# Patient Record
Sex: Male | Born: 1960 | Race: Black or African American | Hispanic: No | Marital: Single | Smoking: Never smoker
Health system: Southern US, Community
[De-identification: ages and names within clinical notes are randomized; demographics above are authoritative.]

## PROBLEM LIST (undated history)

## (undated) DIAGNOSIS — Z800 Family history of malignant neoplasm of digestive organs: Secondary | ICD-10-CM

## (undated) DIAGNOSIS — G4730 Sleep apnea, unspecified: Secondary | ICD-10-CM

## (undated) DIAGNOSIS — E119 Type 2 diabetes mellitus without complications: Secondary | ICD-10-CM

## (undated) DIAGNOSIS — Z86010 Personal history of colonic polyps: Secondary | ICD-10-CM

## (undated) DIAGNOSIS — Z1211 Encounter for screening for malignant neoplasm of colon: Secondary | ICD-10-CM

## (undated) DIAGNOSIS — Z1389 Encounter for screening for other disorder: Secondary | ICD-10-CM

## (undated) HISTORY — DX: Encounter for screening for malignant neoplasm of colon: Z12.11

## (undated) HISTORY — DX: Family history of malignant neoplasm of digestive organs: Z80.0

## (undated) HISTORY — DX: Encounter for screening for other disorder: Z13.89

## (undated) HISTORY — DX: Personal history of colonic polyps: Z86.010

## (undated) HISTORY — DX: Sleep apnea, unspecified: G47.30

## (undated) HISTORY — DX: Type 2 diabetes mellitus without complications: E11.9

## (undated) SURGERY — Surgical Case
Anesthesia: *Unknown

---

## 2005-11-14 ENCOUNTER — Ambulatory Visit: Payer: Self-pay | Admitting: Family Medicine

## 2005-11-17 ENCOUNTER — Ambulatory Visit: Payer: Self-pay | Admitting: Internal Medicine

## 2006-03-16 HISTORY — PX: COLONOSCOPY W/ POLYPECTOMY: SHX1380

## 2007-01-03 ENCOUNTER — Ambulatory Visit: Payer: Self-pay | Admitting: Gastroenterology

## 2007-03-24 ENCOUNTER — Emergency Department: Payer: Self-pay | Admitting: Emergency Medicine

## 2007-03-24 ENCOUNTER — Other Ambulatory Visit: Payer: Self-pay

## 2007-05-02 ENCOUNTER — Ambulatory Visit: Payer: Self-pay | Admitting: Family Medicine

## 2011-03-17 DIAGNOSIS — Z860100 Personal history of colon polyps, unspecified: Secondary | ICD-10-CM

## 2011-03-17 DIAGNOSIS — Z1211 Encounter for screening for malignant neoplasm of colon: Secondary | ICD-10-CM

## 2011-03-17 HISTORY — DX: Encounter for screening for malignant neoplasm of colon: Z12.11

## 2011-03-17 HISTORY — DX: Personal history of colonic polyps: Z86.010

## 2011-03-17 HISTORY — DX: Personal history of colon polyps, unspecified: Z86.0100

## 2012-01-27 ENCOUNTER — Ambulatory Visit: Payer: Self-pay | Admitting: Family Medicine

## 2012-02-03 ENCOUNTER — Ambulatory Visit: Payer: Self-pay | Admitting: General Surgery

## 2012-02-03 HISTORY — PX: COLONOSCOPY: SHX174

## 2012-02-14 ENCOUNTER — Ambulatory Visit: Payer: Self-pay | Admitting: Family Medicine

## 2012-03-16 ENCOUNTER — Ambulatory Visit: Payer: Self-pay | Admitting: Family Medicine

## 2012-04-16 ENCOUNTER — Ambulatory Visit: Payer: Self-pay | Admitting: Family Medicine

## 2012-09-28 ENCOUNTER — Encounter: Payer: Self-pay | Admitting: *Deleted

## 2012-09-28 DIAGNOSIS — Z800 Family history of malignant neoplasm of digestive organs: Secondary | ICD-10-CM | POA: Insufficient documentation

## 2012-09-28 DIAGNOSIS — Z860100 Personal history of colon polyps, unspecified: Secondary | ICD-10-CM | POA: Insufficient documentation

## 2014-03-19 ENCOUNTER — Ambulatory Visit: Payer: Self-pay | Admitting: General Practice

## 2014-03-21 DIAGNOSIS — M6520 Calcific tendinitis, unspecified site: Secondary | ICD-10-CM | POA: Insufficient documentation

## 2014-03-21 DIAGNOSIS — S46919A Strain of unspecified muscle, fascia and tendon at shoulder and upper arm level, unspecified arm, initial encounter: Secondary | ICD-10-CM | POA: Insufficient documentation

## 2014-03-27 ENCOUNTER — Other Ambulatory Visit: Payer: Self-pay | Admitting: General Practice

## 2014-03-27 DIAGNOSIS — M25511 Pain in right shoulder: Secondary | ICD-10-CM

## 2014-03-30 ENCOUNTER — Ambulatory Visit
Admission: RE | Admit: 2014-03-30 | Discharge: 2014-03-30 | Disposition: A | Discharge status: Home / Self Care | Payer: BLUE CROSS/BLUE SHIELD | Source: Ambulatory Visit | Attending: General Practice | Admitting: General Practice

## 2014-03-30 DIAGNOSIS — M25511 Pain in right shoulder: Secondary | ICD-10-CM

## 2014-04-16 DIAGNOSIS — M25819 Other specified joint disorders, unspecified shoulder: Secondary | ICD-10-CM | POA: Insufficient documentation

## 2014-04-16 DIAGNOSIS — M75100 Unspecified rotator cuff tear or rupture of unspecified shoulder, not specified as traumatic: Secondary | ICD-10-CM | POA: Insufficient documentation

## 2014-04-16 DIAGNOSIS — R29898 Other symptoms and signs involving the musculoskeletal system: Secondary | ICD-10-CM | POA: Insufficient documentation

## 2014-09-25 ENCOUNTER — Encounter: Payer: Self-pay | Admitting: Family Medicine

## 2014-09-25 ENCOUNTER — Ambulatory Visit (INDEPENDENT_AMBULATORY_CARE_PROVIDER_SITE_OTHER): Payer: BLUE CROSS/BLUE SHIELD | Admitting: Family Medicine

## 2014-09-25 VITALS — BP 124/70 | HR 82 | Temp 98.6°F | Resp 18 | Ht 69.0 in | Wt 292.5 lb

## 2014-09-25 DIAGNOSIS — E669 Obesity, unspecified: Secondary | ICD-10-CM | POA: Diagnosis not present

## 2014-09-25 DIAGNOSIS — Z820 Family history of epilepsy and other diseases of the nervous system: Secondary | ICD-10-CM | POA: Diagnosis not present

## 2014-09-25 DIAGNOSIS — M25519 Pain in unspecified shoulder: Secondary | ICD-10-CM

## 2014-09-25 DIAGNOSIS — M25819 Other specified joint disorders, unspecified shoulder: Secondary | ICD-10-CM | POA: Diagnosis not present

## 2014-09-25 DIAGNOSIS — M25511 Pain in right shoulder: Secondary | ICD-10-CM | POA: Diagnosis not present

## 2014-09-25 DIAGNOSIS — M25512 Pain in left shoulder: Secondary | ICD-10-CM

## 2014-09-25 DIAGNOSIS — M6520 Calcific tendinitis, unspecified site: Secondary | ICD-10-CM

## 2014-09-25 DIAGNOSIS — Z6841 Body Mass Index (BMI) 40.0 and over, adult: Secondary | ICD-10-CM | POA: Insufficient documentation

## 2014-09-25 DIAGNOSIS — Z0000 Encounter for general adult medical examination without abnormal findings: Secondary | ICD-10-CM | POA: Diagnosis not present

## 2014-09-25 MED ORDER — SUMATRIPTAN SUCCINATE 100 MG PO TABS: 100.0000 mg | ORAL_TABLET | ORAL | Status: DC | PRN

## 2014-09-25 NOTE — Patient Instructions (Addendum)

## 2014-09-25 NOTE — Progress Notes (Signed)
Name: John York   MRN: [id number]    DOB: 15-Mar-1961   Date:09/25/2014       Progress Note  Subjective  Chief Complaint  Chief Complaint  Patient presents with  . Annual Exam    HPI  54 year old male presents for annual H&P . Since last physical he said a few new medical modalities as outlined below.  Right shoulder injury  Patient fell several months ago onto his extended arms and had a significant sprain particularly of the right shoulder. He was thought originally to have a rotator tuft cuff tear. He has done some rehabilitation and this is improving though there is still some residual pain. It is of note that he has played competitive football college level  Obesity  Patient is not doing regular aerobic exercise are the following a calorie restricted diet. He often eats on the run as he is single and works at [employer].  Migraines with aura  Patient has had 2 or 3 episodes of migraines with visual aura consisting of scotomata. The headaches are not severe. They resolved on their own. There is no associated nausea vomiting or associated focal weakness.  Past Medical History  Diagnosis Date  . Family history of malignant neoplasm of gastrointestinal tract   . Special screening for malignant neoplasms, colon 2013  . Screening for obesity   . Personal history of colonic polyps 2013    History  Substance Use Topics  . Smoking status: Never Smoker   . Smokeless tobacco: Not on file  . Alcohol Use: No     Current outpatient prescriptions:  .  Calcium Carb-Ergocalciferol 500-200 MG-UNIT TABS, Take by mouth., Disp: , Rfl:   No Known Allergies  Review of Systems  Constitutional: Negative for fever, chills and weight loss.  HENT: Negative for congestion, hearing loss, sore throat and tinnitus.   Eyes: Negative for blurred vision, double vision and redness.  Respiratory: Negative for cough, hemoptysis and shortness of breath.   Cardiovascular: Negative for  chest pain, palpitations, orthopnea, claudication and leg swelling.  Gastrointestinal: Negative for heartburn, nausea, vomiting, diarrhea, constipation and blood in stool.  Genitourinary: Negative for dysuria, urgency, frequency and hematuria.  Musculoskeletal: Negative for myalgias, back pain, joint pain, falls and neck pain.  Skin: Negative for itching.  Neurological: Positive for headaches. Negative for dizziness, tingling, tremors, focal weakness, seizures, loss of consciousness and weakness. Speech change: occasional scotomata associated with migraines.  Endo/Heme/Allergies: Does not bruise/bleed easily.  Psychiatric/Behavioral: Negative for depression and substance abuse. The patient is not nervous/anxious and does not have insomnia.      Objective  Filed Vitals:   09/25/14 1344  BP: 124/70  Pulse: 82  Temp: 98.6 F (37 C)  TempSrc: Oral  Resp: 18  Height: 5\' 9"  (1.753 m)  Weight: 292 lb 8 oz (132.677 kg)  SpO2: 97%     Physical Exam  Constitutional: He is oriented to person, place, and time and well-developed, well-nourished, and in no distress.  Obese  HENT:  Head: Normocephalic.  Eyes: EOM are normal. Pupils are equal, round, and reactive to light.  Neck: Normal range of motion. Neck supple. No thyromegaly present.  Cardiovascular: Normal rate, regular rhythm and normal heart sounds.   No murmur heard. Pulmonary/Chest: Effort normal and breath sounds normal. No respiratory distress. He has no wheezes.  Abdominal: Soft. Bowel sounds are normal.  Genitourinary: Rectum normal, prostate normal and penis normal. Guaiac negative stool. No discharge found.  Musculoskeletal: He exhibits no  edema.  Mild limitation of abduction of the right shoulder but some crepitus and discomfort with full abduction  Lymphadenopathy:    He has no cervical adenopathy.  Neurological: He is alert and oriented to person, place, and time. No cranial nerve deficit. Gait normal. Coordination  normal.  Skin: Skin is warm and dry. No rash noted.  Psychiatric: Mood, memory, affect and judgment normal.      Assessment & Plan  1. Annual physical exam Labs [facility] - Comprehensive metabolic panel - Lipid panel - TSH - CBC with Differential/Platelet - PSA  2. Obesity Patient informed, pale with thick diet  3. FHx: migraine headaches We'll give a trial of Imitrex   4. Shoulder pain, bilateral per orthopedist

## 2014-09-25 NOTE — Addendum Note (Signed)
Addended by: Ashok Norris on: 09/25/2014 03:37 PM   Modules accepted: Miquel Dunn

## 2014-10-02 ENCOUNTER — Encounter: Payer: Self-pay | Admitting: Family Medicine

## 2014-10-10 ENCOUNTER — Encounter: Payer: Self-pay | Admitting: Family Medicine

## 2015-04-01 ENCOUNTER — Encounter: Payer: Self-pay | Admitting: Family Medicine

## 2015-04-01 ENCOUNTER — Ambulatory Visit (INDEPENDENT_AMBULATORY_CARE_PROVIDER_SITE_OTHER): Payer: BLUE CROSS/BLUE SHIELD | Admitting: Family Medicine

## 2015-04-01 ENCOUNTER — Ambulatory Visit: Payer: BLUE CROSS/BLUE SHIELD | Admitting: Family Medicine

## 2015-04-01 ENCOUNTER — Ambulatory Visit
Admission: RE | Admit: 2015-04-01 | Discharge: 2015-04-01 | Disposition: A | Discharge status: Home / Self Care | Payer: BLUE CROSS/BLUE SHIELD | Source: Ambulatory Visit | Attending: Family Medicine | Admitting: Family Medicine

## 2015-04-01 VITALS — BP 124/82 | HR 87 | Temp 98.3°F | Resp 18 | Ht 69.0 in | Wt 295.9 lb

## 2015-04-01 DIAGNOSIS — M25512 Pain in left shoulder: Secondary | ICD-10-CM | POA: Insufficient documentation

## 2015-04-01 DIAGNOSIS — M542 Cervicalgia: Secondary | ICD-10-CM

## 2015-04-01 DIAGNOSIS — E669 Obesity, unspecified: Secondary | ICD-10-CM | POA: Diagnosis not present

## 2015-04-01 MED ORDER — MELOXICAM 15 MG PO TABS: 15.0000 mg | ORAL_TABLET | Freq: Every day | ORAL | Status: DC

## 2015-04-01 NOTE — Progress Notes (Signed)
Name: John York   MRN: [id number]    DOB: 1960/08/22   Date:04/01/2015       Progress Note  Subjective  Chief Complaint  Chief Complaint  Patient presents with  . Obesity    6 month recheck    HPI  Obesity many  Patient has a history of obesity for many years.  Attempts at weight loss have included diet and exercise and diet. Marland Kitchen  Results of this regimen  have been less than successful .  Patient now voices and interest in weight loss by  as minimal. He is not following the diet and exercises prescribed 6 months ago. With regular .  Past Medical History  Diagnosis Date  . Family history of malignant neoplasm of gastrointestinal tract   . Special screening for malignant neoplasms, colon 2013  . Screening for obesity   . Personal history of colonic polyps 2013    Social History  Substance Use Topics  . Smoking status: Never Smoker   . Smokeless tobacco: Not on file  . Alcohol Use: No     Current outpatient prescriptions:  .  Calcium Carb-Ergocalciferol 500-200 MG-UNIT TABS, Take by mouth., Disp: , Rfl:  .  SUMAtriptan (IMITREX) 100 MG tablet, Take 1 tablet (100 mg total) by mouth every 2 (two) hours as needed for migraine. May repeat in 2 hours if headache persists or recurs., Disp: 6 tablet, Rfl: 1  No Known Allergies  Review of Systems  Constitutional: Negative for fever, chills and weight loss.       Obesity  HENT: Negative for congestion, hearing loss, sore throat and tinnitus.   Eyes: Negative for blurred vision, double vision and redness.  Respiratory: Negative for cough, hemoptysis and shortness of breath.   Cardiovascular: Negative for chest pain, palpitations, orthopnea, claudication and leg swelling.  Gastrointestinal: Negative for heartburn, nausea, vomiting, diarrhea, constipation and blood in stool.  Genitourinary: Negative for dysuria, urgency, frequency and hematuria.  Musculoskeletal: Negative for myalgias, back pain, joint pain, falls and neck  pain.  Skin: Negative for itching.  Neurological: Negative for dizziness, tingling, tremors, focal weakness, seizures, loss of consciousness, weakness and headaches.  Endo/Heme/Allergies: Does not bruise/bleed easily.  Psychiatric/Behavioral: Negative for depression and substance abuse. The patient is not nervous/anxious and does not have insomnia.      Objective  Filed Vitals:   04/01/15 0738  BP: 124/82  Pulse: 87  Temp: 98.3 F (36.8 C)  TempSrc: Oral  Resp: 18  Height: 5\' 9"  (1.753 m)  Weight: 295 lb 14.4 oz (134.219 kg)  SpO2: 97%     Physical Exam  Constitutional: He is oriented to person, place, and time and well-developed, well-nourished, and in no distress.  Obese and in no acute distress  HENT:  Head: Normocephalic.  Eyes: EOM are normal. Pupils are equal, round, and reactive to light.  Neck: Normal range of motion. Neck supple. No thyromegaly present.  Cardiovascular: Normal rate, regular rhythm and normal heart sounds.   No murmur heard. Pulmonary/Chest: Effort normal and breath sounds normal. No respiratory distress. He has no wheezes.  Abdominal: Soft. Bowel sounds are normal.  Musculoskeletal: He exhibits no edema.  There is some left cervical discomfort and sternocleidomastoid area and mild limitation on lateral rotation. Some discomfort in the scapular area as well. Internal and external rotation at the extremes produce some discomfort as well there is tenderness along the tendons anteriorly posteriorly the shoulder.  Lymphadenopathy:    He has no cervical adenopathy.  Neurological: He is alert and oriented to person, place, and time. No cranial nerve deficit. Gait normal. Coordination normal.  Skin: Skin is warm and dry. No rash noted.  Psychiatric: Affect and judgment normal.      Assessment & Plan  1. Obesity Again complaint better with diet and exercise have emphasized today  2. Shoulder pain, acute, left Probable tendinitis and  osteoarthritis - meloxicam (MOBIC) 15 MG tablet; Take 1 tablet (15 mg total) by mouth daily.  Dispense: 30 tablet; Refill: 1 - DG Shoulder Left; Future - DG Cervical Spine Complete; Future - Ambulatory referral to Physical Therapy  3. 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  Dispense: 30 tablet; Refill: 1 - DG Shoulder Left; Future - DG Cervical Spine Complete; Future - Ambulatory referral to Physical Therapy

## 2015-05-27 ENCOUNTER — Other Ambulatory Visit: Payer: Self-pay | Admitting: Family Medicine

## 2015-10-10 ENCOUNTER — Encounter: Payer: BLUE CROSS/BLUE SHIELD | Admitting: Family Medicine

## 2016-04-16 DIAGNOSIS — M5136 Other intervertebral disc degeneration, lumbar region: Secondary | ICD-10-CM | POA: Diagnosis not present

## 2016-04-16 DIAGNOSIS — M9901 Segmental and somatic dysfunction of cervical region: Secondary | ICD-10-CM | POA: Diagnosis not present

## 2016-04-16 DIAGNOSIS — M62830 Muscle spasm of back: Secondary | ICD-10-CM | POA: Diagnosis not present

## 2016-04-16 DIAGNOSIS — M9903 Segmental and somatic dysfunction of lumbar region: Secondary | ICD-10-CM | POA: Diagnosis not present

## 2016-04-23 DIAGNOSIS — M9903 Segmental and somatic dysfunction of lumbar region: Secondary | ICD-10-CM | POA: Diagnosis not present

## 2016-04-23 DIAGNOSIS — M5136 Other intervertebral disc degeneration, lumbar region: Secondary | ICD-10-CM | POA: Diagnosis not present

## 2016-04-23 DIAGNOSIS — M62830 Muscle spasm of back: Secondary | ICD-10-CM | POA: Diagnosis not present

## 2016-04-23 DIAGNOSIS — M9901 Segmental and somatic dysfunction of cervical region: Secondary | ICD-10-CM | POA: Diagnosis not present

## 2016-04-30 DIAGNOSIS — M9903 Segmental and somatic dysfunction of lumbar region: Secondary | ICD-10-CM | POA: Diagnosis not present

## 2016-04-30 DIAGNOSIS — M9901 Segmental and somatic dysfunction of cervical region: Secondary | ICD-10-CM | POA: Diagnosis not present

## 2016-04-30 DIAGNOSIS — M62830 Muscle spasm of back: Secondary | ICD-10-CM | POA: Diagnosis not present

## 2016-04-30 DIAGNOSIS — M5136 Other intervertebral disc degeneration, lumbar region: Secondary | ICD-10-CM | POA: Diagnosis not present

## 2016-05-04 DIAGNOSIS — M5136 Other intervertebral disc degeneration, lumbar region: Secondary | ICD-10-CM | POA: Diagnosis not present

## 2016-05-04 DIAGNOSIS — M62830 Muscle spasm of back: Secondary | ICD-10-CM | POA: Diagnosis not present

## 2016-05-04 DIAGNOSIS — M9903 Segmental and somatic dysfunction of lumbar region: Secondary | ICD-10-CM | POA: Diagnosis not present

## 2016-05-04 DIAGNOSIS — M9901 Segmental and somatic dysfunction of cervical region: Secondary | ICD-10-CM | POA: Diagnosis not present

## 2016-05-13 DIAGNOSIS — M5136 Other intervertebral disc degeneration, lumbar region: Secondary | ICD-10-CM | POA: Diagnosis not present

## 2016-05-13 DIAGNOSIS — M9901 Segmental and somatic dysfunction of cervical region: Secondary | ICD-10-CM | POA: Diagnosis not present

## 2016-05-13 DIAGNOSIS — M9903 Segmental and somatic dysfunction of lumbar region: Secondary | ICD-10-CM | POA: Diagnosis not present

## 2016-05-13 DIAGNOSIS — M62830 Muscle spasm of back: Secondary | ICD-10-CM | POA: Diagnosis not present

## 2016-05-20 DIAGNOSIS — M9903 Segmental and somatic dysfunction of lumbar region: Secondary | ICD-10-CM | POA: Diagnosis not present

## 2016-05-20 DIAGNOSIS — M5136 Other intervertebral disc degeneration, lumbar region: Secondary | ICD-10-CM | POA: Diagnosis not present

## 2016-05-20 DIAGNOSIS — M9901 Segmental and somatic dysfunction of cervical region: Secondary | ICD-10-CM | POA: Diagnosis not present

## 2016-05-20 DIAGNOSIS — M62830 Muscle spasm of back: Secondary | ICD-10-CM | POA: Diagnosis not present

## 2016-05-28 DIAGNOSIS — M9903 Segmental and somatic dysfunction of lumbar region: Secondary | ICD-10-CM | POA: Diagnosis not present

## 2016-05-28 DIAGNOSIS — M62830 Muscle spasm of back: Secondary | ICD-10-CM | POA: Diagnosis not present

## 2016-05-28 DIAGNOSIS — M5136 Other intervertebral disc degeneration, lumbar region: Secondary | ICD-10-CM | POA: Diagnosis not present

## 2016-05-28 DIAGNOSIS — M9901 Segmental and somatic dysfunction of cervical region: Secondary | ICD-10-CM | POA: Diagnosis not present

## 2016-06-04 DIAGNOSIS — M5136 Other intervertebral disc degeneration, lumbar region: Secondary | ICD-10-CM | POA: Diagnosis not present

## 2016-06-04 DIAGNOSIS — M9901 Segmental and somatic dysfunction of cervical region: Secondary | ICD-10-CM | POA: Diagnosis not present

## 2016-06-04 DIAGNOSIS — M62830 Muscle spasm of back: Secondary | ICD-10-CM | POA: Diagnosis not present

## 2016-06-04 DIAGNOSIS — M9903 Segmental and somatic dysfunction of lumbar region: Secondary | ICD-10-CM | POA: Diagnosis not present

## 2016-06-10 DIAGNOSIS — M5136 Other intervertebral disc degeneration, lumbar region: Secondary | ICD-10-CM | POA: Diagnosis not present

## 2016-06-10 DIAGNOSIS — M62830 Muscle spasm of back: Secondary | ICD-10-CM | POA: Diagnosis not present

## 2016-06-10 DIAGNOSIS — M9903 Segmental and somatic dysfunction of lumbar region: Secondary | ICD-10-CM | POA: Diagnosis not present

## 2016-06-10 DIAGNOSIS — M9901 Segmental and somatic dysfunction of cervical region: Secondary | ICD-10-CM | POA: Diagnosis not present

## 2016-06-24 DIAGNOSIS — M9901 Segmental and somatic dysfunction of cervical region: Secondary | ICD-10-CM | POA: Diagnosis not present

## 2016-06-24 DIAGNOSIS — M62830 Muscle spasm of back: Secondary | ICD-10-CM | POA: Diagnosis not present

## 2016-06-24 DIAGNOSIS — M9903 Segmental and somatic dysfunction of lumbar region: Secondary | ICD-10-CM | POA: Diagnosis not present

## 2016-06-24 DIAGNOSIS — M5136 Other intervertebral disc degeneration, lumbar region: Secondary | ICD-10-CM | POA: Diagnosis not present

## 2016-07-09 DIAGNOSIS — M62830 Muscle spasm of back: Secondary | ICD-10-CM | POA: Diagnosis not present

## 2016-07-09 DIAGNOSIS — M9901 Segmental and somatic dysfunction of cervical region: Secondary | ICD-10-CM | POA: Diagnosis not present

## 2016-07-09 DIAGNOSIS — M9903 Segmental and somatic dysfunction of lumbar region: Secondary | ICD-10-CM | POA: Diagnosis not present

## 2016-07-09 DIAGNOSIS — M5136 Other intervertebral disc degeneration, lumbar region: Secondary | ICD-10-CM | POA: Diagnosis not present

## 2016-07-23 DIAGNOSIS — M9901 Segmental and somatic dysfunction of cervical region: Secondary | ICD-10-CM | POA: Diagnosis not present

## 2016-07-23 DIAGNOSIS — M5136 Other intervertebral disc degeneration, lumbar region: Secondary | ICD-10-CM | POA: Diagnosis not present

## 2016-07-23 DIAGNOSIS — M62830 Muscle spasm of back: Secondary | ICD-10-CM | POA: Diagnosis not present

## 2016-07-23 DIAGNOSIS — M9903 Segmental and somatic dysfunction of lumbar region: Secondary | ICD-10-CM | POA: Diagnosis not present

## 2016-08-05 ENCOUNTER — Ambulatory Visit: Payer: Self-pay | Admitting: Medical

## 2016-08-05 VITALS — BP 120/75 | HR 74 | Temp 97.5°F | Resp 20 | Ht 69.0 in | Wt 301.0 lb

## 2016-08-05 DIAGNOSIS — R1084 Generalized abdominal pain: Secondary | ICD-10-CM

## 2016-08-05 DIAGNOSIS — R1013 Epigastric pain: Secondary | ICD-10-CM

## 2016-08-05 MED ORDER — OMEPRAZOLE 20 MG PO CPDR: 20.0000 mg | DELAYED_RELEASE_CAPSULE | Freq: Every day | ORAL | 1 refills | Status: DC

## 2016-08-05 NOTE — Patient Instructions (Signed)
Epigastric pain e-prescribed  Omeprazole 20mg  daily #30 one refill Diarrhea try OTC Pepto Bismol take as directed. Bland diet Brat diet  Bananas , rice , applesauce and toast. Rest and increase fluids.  May take OTC tylenol as needed for pain, take as directed. Return to the clinic if symptoms worsen , vomiting or if not improving in  2-3 days.

## 2016-08-05 NOTE — Progress Notes (Signed)
   Subjective:    Patient ID: John York, male    DOB: [birth year], 56 y.o.   MRN: [id number]  HPI 56 yo  Male with headache yesterday, ate [place] for lunch. Felt tired and weak. Diarrhea   4 times yesterday. No blood in stool.  Felt hot yesterday and body aches.  This morning  3 times diarrhea today but not much came out. Mild nausea no vomiting , Pain is dull , intermittent and last a few minutes or so. Headache today as well. Took two tylenol   last night does not know milligrams.   Taking vitamin D3 4000 IU/day. Pain located epigastric area.  Review of Systems  Constitutional: Positive for fever.  HENT: Negative for congestion, ear discharge and sore throat.   Eyes: Negative for discharge.  Respiratory: Negative for shortness of breath.   Cardiovascular: Negative for chest pain.  Gastrointestinal: Positive for abdominal pain, diarrhea and nausea. Negative for vomiting.  Endocrine: Negative for polyuria.  Genitourinary: Negative for hematuria.  Musculoskeletal: Negative for myalgias.  Skin: Negative for rash.  Allergic/Immunologic: Negative for environmental allergies and food allergies.  Neurological: Positive for light-headedness. Negative for syncope.  Psychiatric/Behavioral: Negative for confusion and hallucinations.   Lightheadedness feels like vertigo he has had in the past.    Objective:   Physical Exam  Constitutional: He is oriented to person, place, and time. He appears well-nourished.  HENT:  Head: Normocephalic and atraumatic.  Right Ear: External ear normal.  Left Ear: External ear normal.  Nose: Nose normal.  Mouth/Throat: Oropharynx is clear and moist.  Eyes: EOM are normal. Pupils are equal, round, and reactive to light.  Neck: Normal range of motion. Neck supple.  Cardiovascular: Normal rate, regular rhythm and normal heart sounds.  Exam reveals no gallop and no friction rub.   No murmur heard. Pulmonary/Chest: Effort normal and breath sounds normal.   Abdominal: Soft. There is tenderness in the epigastric area. There is no rigidity, no rebound, no guarding, no tenderness at McBurney's point and negative Murphy's sign.  Musculoskeletal: Normal range of motion.  Neurological: He is alert and oriented to person, place, and time.  Skin: Skin is warm and dry.  Psychiatric: He has a normal mood and affect. His behavior is normal.  Nursing note and vitals reviewed.    Morbid obese abdomen Tender epigastric + bs increased lower abdomen., non-tender.    Assessment & Plan:  Epigastric pain e-prescribed  Omeprazole 20mg  daily #30 one refill Diarrhea try OTC Pepto Bismol take as directed. Bland diet  Rest and increase fluids.  May take OTC tylenol as needed for pain, take as directed. Work note given to patient. Return to the clinic if symptoms worsen , vomiting or if not improving in  2-3 days.

## 2016-08-06 ENCOUNTER — Telehealth: Payer: Self-pay | Admitting: Medical

## 2016-08-06 LAB — CBC WITH DIFFERENTIAL/PLATELET
BASOS ABS: 0 10*3/uL (ref 0.0–0.2)
BASOS: 0 %
EOS (ABSOLUTE): 0 10*3/uL (ref 0.0–0.4)
Eos: 1 %
Hematocrit: 44.7 % (ref 37.5–51.0)
Hemoglobin: 15.7 g/dL (ref 13.0–17.7)
IMMATURE GRANULOCYTES: 1 %
Immature Grans (Abs): 0 10*3/uL (ref 0.0–0.1)
Lymphocytes Absolute: 0.8 10*3/uL (ref 0.7–3.1)
Lymphs: 20 %
MCH: 30.2 pg (ref 26.6–33.0)
MCHC: 35.1 g/dL (ref 31.5–35.7)
MCV: 86 fL (ref 79–97)
MONOS ABS: 0.4 10*3/uL (ref 0.1–0.9)
Monocytes: 11 %
Neutrophils Absolute: 2.9 10*3/uL (ref 1.4–7.0)
Neutrophils: 67 %
PLATELETS: 212 10*3/uL (ref 150–379)
RBC: 5.2 x10E6/uL (ref 4.14–5.80)
RDW: 14.3 % (ref 12.3–15.4)
WBC: 4.2 10*3/uL (ref 3.4–10.8)

## 2016-08-06 LAB — COMPREHENSIVE METABOLIC PANEL
ALK PHOS: 97 IU/L (ref 39–117)
ALT: 24 IU/L (ref 0–44)
AST: 25 IU/L (ref 0–40)
Albumin/Globulin Ratio: 1.5 (ref 1.2–2.2)
Albumin: 4.1 g/dL (ref 3.5–5.5)
BUN/Creatinine Ratio: 12 (ref 9–20)
BUN: 13 mg/dL (ref 6–24)
Bilirubin Total: 0.5 mg/dL (ref 0.0–1.2)
CHLORIDE: 99 mmol/L (ref 96–106)
CO2: 26 mmol/L (ref 18–29)
Calcium: 9 mg/dL (ref 8.7–10.2)
Creatinine, Ser: 1.13 mg/dL (ref 0.76–1.27)
GFR calc Af Amer: 84 mL/min/{1.73_m2} (ref >59)
GFR, EST NON AFRICAN AMERICAN: 72 mL/min/{1.73_m2} (ref >59)
GLUCOSE: 94 mg/dL (ref 65–99)
Globulin, Total: 2.8 g/dL (ref 1.5–4.5)
POTASSIUM: 4.1 mmol/L (ref 3.5–5.2)
SODIUM: 136 mmol/L (ref 134–144)
Total Protein: 6.9 g/dL (ref 6.0–8.5)

## 2016-08-06 LAB — LIPASE: Lipase: 27 U/L (ref 13–78)

## 2016-08-06 NOTE — Telephone Encounter (Signed)
Called twice no answer. Left message to please return my call.

## 2016-08-06 NOTE — Telephone Encounter (Signed)
Patient called back , feeling better today. Reviewed labs with patient . All within normal limits. Return to the clinic as needed.

## 2016-08-17 DIAGNOSIS — M9903 Segmental and somatic dysfunction of lumbar region: Secondary | ICD-10-CM | POA: Diagnosis not present

## 2016-08-17 DIAGNOSIS — M62830 Muscle spasm of back: Secondary | ICD-10-CM | POA: Diagnosis not present

## 2016-08-17 DIAGNOSIS — M5136 Other intervertebral disc degeneration, lumbar region: Secondary | ICD-10-CM | POA: Diagnosis not present

## 2016-08-17 DIAGNOSIS — M9901 Segmental and somatic dysfunction of cervical region: Secondary | ICD-10-CM | POA: Diagnosis not present

## 2016-08-27 DIAGNOSIS — M9901 Segmental and somatic dysfunction of cervical region: Secondary | ICD-10-CM | POA: Diagnosis not present

## 2016-08-27 DIAGNOSIS — M62830 Muscle spasm of back: Secondary | ICD-10-CM | POA: Diagnosis not present

## 2016-08-27 DIAGNOSIS — M5136 Other intervertebral disc degeneration, lumbar region: Secondary | ICD-10-CM | POA: Diagnosis not present

## 2016-08-27 DIAGNOSIS — M9903 Segmental and somatic dysfunction of lumbar region: Secondary | ICD-10-CM | POA: Diagnosis not present

## 2016-08-31 DIAGNOSIS — M9901 Segmental and somatic dysfunction of cervical region: Secondary | ICD-10-CM | POA: Diagnosis not present

## 2016-08-31 DIAGNOSIS — M5136 Other intervertebral disc degeneration, lumbar region: Secondary | ICD-10-CM | POA: Diagnosis not present

## 2016-08-31 DIAGNOSIS — M9903 Segmental and somatic dysfunction of lumbar region: Secondary | ICD-10-CM | POA: Diagnosis not present

## 2016-08-31 DIAGNOSIS — M62830 Muscle spasm of back: Secondary | ICD-10-CM | POA: Diagnosis not present

## 2016-09-17 DIAGNOSIS — M9901 Segmental and somatic dysfunction of cervical region: Secondary | ICD-10-CM | POA: Diagnosis not present

## 2016-09-17 DIAGNOSIS — M62830 Muscle spasm of back: Secondary | ICD-10-CM | POA: Diagnosis not present

## 2016-09-17 DIAGNOSIS — M9903 Segmental and somatic dysfunction of lumbar region: Secondary | ICD-10-CM | POA: Diagnosis not present

## 2016-09-17 DIAGNOSIS — M5136 Other intervertebral disc degeneration, lumbar region: Secondary | ICD-10-CM | POA: Diagnosis not present

## 2016-10-07 DIAGNOSIS — M9901 Segmental and somatic dysfunction of cervical region: Secondary | ICD-10-CM | POA: Diagnosis not present

## 2016-10-07 DIAGNOSIS — M5136 Other intervertebral disc degeneration, lumbar region: Secondary | ICD-10-CM | POA: Diagnosis not present

## 2016-10-07 DIAGNOSIS — M9903 Segmental and somatic dysfunction of lumbar region: Secondary | ICD-10-CM | POA: Diagnosis not present

## 2016-10-07 DIAGNOSIS — M62830 Muscle spasm of back: Secondary | ICD-10-CM | POA: Diagnosis not present

## 2016-10-21 DIAGNOSIS — M62830 Muscle spasm of back: Secondary | ICD-10-CM | POA: Diagnosis not present

## 2016-10-21 DIAGNOSIS — M5136 Other intervertebral disc degeneration, lumbar region: Secondary | ICD-10-CM | POA: Diagnosis not present

## 2016-10-21 DIAGNOSIS — M9903 Segmental and somatic dysfunction of lumbar region: Secondary | ICD-10-CM | POA: Diagnosis not present

## 2016-10-21 DIAGNOSIS — M9901 Segmental and somatic dysfunction of cervical region: Secondary | ICD-10-CM | POA: Diagnosis not present

## 2016-11-12 DIAGNOSIS — M62830 Muscle spasm of back: Secondary | ICD-10-CM | POA: Diagnosis not present

## 2016-11-12 DIAGNOSIS — M9903 Segmental and somatic dysfunction of lumbar region: Secondary | ICD-10-CM | POA: Diagnosis not present

## 2016-11-12 DIAGNOSIS — M9901 Segmental and somatic dysfunction of cervical region: Secondary | ICD-10-CM | POA: Diagnosis not present

## 2016-11-12 DIAGNOSIS — M5136 Other intervertebral disc degeneration, lumbar region: Secondary | ICD-10-CM | POA: Diagnosis not present

## 2016-12-07 DIAGNOSIS — M62830 Muscle spasm of back: Secondary | ICD-10-CM | POA: Diagnosis not present

## 2016-12-07 DIAGNOSIS — M9903 Segmental and somatic dysfunction of lumbar region: Secondary | ICD-10-CM | POA: Diagnosis not present

## 2016-12-07 DIAGNOSIS — M9901 Segmental and somatic dysfunction of cervical region: Secondary | ICD-10-CM | POA: Diagnosis not present

## 2016-12-07 DIAGNOSIS — M5136 Other intervertebral disc degeneration, lumbar region: Secondary | ICD-10-CM | POA: Diagnosis not present

## 2016-12-21 DIAGNOSIS — M9903 Segmental and somatic dysfunction of lumbar region: Secondary | ICD-10-CM | POA: Diagnosis not present

## 2016-12-21 DIAGNOSIS — M5136 Other intervertebral disc degeneration, lumbar region: Secondary | ICD-10-CM | POA: Diagnosis not present

## 2016-12-21 DIAGNOSIS — M62830 Muscle spasm of back: Secondary | ICD-10-CM | POA: Diagnosis not present

## 2016-12-21 DIAGNOSIS — M9901 Segmental and somatic dysfunction of cervical region: Secondary | ICD-10-CM | POA: Diagnosis not present

## 2017-01-06 DIAGNOSIS — M5136 Other intervertebral disc degeneration, lumbar region: Secondary | ICD-10-CM | POA: Diagnosis not present

## 2017-01-06 DIAGNOSIS — M62830 Muscle spasm of back: Secondary | ICD-10-CM | POA: Diagnosis not present

## 2017-01-06 DIAGNOSIS — M9901 Segmental and somatic dysfunction of cervical region: Secondary | ICD-10-CM | POA: Diagnosis not present

## 2017-01-06 DIAGNOSIS — M9903 Segmental and somatic dysfunction of lumbar region: Secondary | ICD-10-CM | POA: Diagnosis not present

## 2017-01-14 ENCOUNTER — Ambulatory Visit: Payer: Self-pay | Admitting: General Surgery

## 2017-01-18 ENCOUNTER — Ambulatory Visit: Payer: BLUE CROSS/BLUE SHIELD | Admitting: General Surgery

## 2017-01-18 ENCOUNTER — Encounter: Payer: Self-pay | Admitting: General Surgery

## 2017-01-18 VITALS — BP 130/78 | HR 78 | Resp 14 | Ht 69.0 in | Wt 310.0 lb

## 2017-01-18 DIAGNOSIS — Z800 Family history of malignant neoplasm of digestive organs: Secondary | ICD-10-CM | POA: Diagnosis not present

## 2017-01-18 DIAGNOSIS — Z86010 Personal history of colonic polyps: Secondary | ICD-10-CM | POA: Diagnosis not present

## 2017-01-18 MED ORDER — POLYETHYLENE GLYCOL 3350 17 GM/SCOOP PO POWD: 1.0000 | Freq: Once | ORAL | 0 refills | Status: AC

## 2017-01-18 NOTE — Progress Notes (Signed)
Patient ID: John York, male   DOB: 10/24/1960, 56 y.o.   MRN: [id number]  Chief Complaint  Patient presents with  . Colonoscopy    HPI John York is a 56 y.o. male here today for a evaluation of a colonoscopy . Last colonoscopy was done on 02/03/2012 and revealed one polyp. Patient denies GI problems at this time. Moves his bowels daily. Positive family history of colon cancer- brother.   HPI  Past Medical History:  Diagnosis Date  . Family history of malignant neoplasm of gastrointestinal tract   . Personal history of colonic polyps 2013  . Screening for obesity   . Special screening for malignant neoplasms, colon 2013    Past Surgical History:  Procedure Laterality Date  . COLONOSCOPY  02/03/2012   Dr. Jamal Collin  . COLONOSCOPY W/ POLYPECTOMY  2008   56mm size sessile non bleeding polyp was found in the sigmoid colon    Family History  Problem Relation Age of Onset  . Cancer Sister        breast  . Colon cancer Brother     Social History Social History   Tobacco Use  . Smoking status: Never Smoker  . Smokeless tobacco: Never Used  Substance Use Topics  . Alcohol use: No    Alcohol/week: 0.0 oz  . Drug use: No    No Known Allergies  Current Outpatient Medications  Medication Sig Dispense Refill  . cholecalciferol (VITAMIN D) 1000 units tablet Take 1,000 Units daily by mouth.    . polyethylene glycol powder (GLYCOLAX/MIRALAX) powder Take 255 g once for 1 dose by mouth. 255 grams one bottle for colonoscopy prep 255 g 0   No current facility-administered medications for this visit.     Review of Systems Review of Systems  Constitutional: Negative.   Respiratory: Negative.   Cardiovascular: Negative.   Gastrointestinal: Negative.     Blood pressure 130/78, pulse 78, resp. rate 14, height 5\' 9"  (1.753 m), weight (!) 310 lb (140.6 kg).  Physical Exam Physical Exam  Constitutional: He is oriented to person, place, and time. He appears  well-developed and well-nourished.  Eyes: Conjunctivae are normal. No scleral icterus.  Neck: Neck supple.  Cardiovascular: Normal rate, regular rhythm and normal heart sounds.  Pulmonary/Chest: Effort normal and breath sounds normal.  Abdominal: Soft. Bowel sounds are normal. There is no tenderness. No hernia. Hernia confirmed negative in the ventral area, confirmed negative in the right inguinal area and confirmed negative in the left inguinal area.  Lymphadenopathy:    He has no cervical adenopathy.  Neurological: He is alert and oriented to person, place, and time.  Skin: Skin is warm and dry.    Data Reviewed Prior notes and colonoscopy reviewed   Assessment    Pt with positive family hx of colon cancer and personal history of polyps is due for colonoscopy. Last colonoscopy and single polypectomy was 02/03/12. Stable exam today.    Plan    Colonoscopy with possible biopsy/polypectomy prn: Information regarding the procedure, including its potential risks and complications (including but not limited to perforation of the bowel, which may require emergency surgery to repair, and bleeding) was verbally given to the patient. Educational information regarding lower intestinal endoscopy was given to the patient. Written instructions for how to complete the bowel prep using Miralax were provided. The importance of drinking ample fluids to avoid dehydration as a result of the prep emphasized.  HPI, Physical Exam, Assessment and Plan have been scribed under  the direction and in the presence of Mckinley Jewel, MD  Gaspar Cola, CMA      I have completed the exam and reviewed the above documentation for accuracy and completeness.  I agree with the above.  Haematologist has been used and any errors in dictation or transcription are unintentional.  Seeplaputhur G. Jamal Collin, M.D., F.A.C.S.  Junie Panning G 01/18/2017, 11:43 AM  Patient has been scheduled for a colonoscopy on  02-24-17 at [facility]. Miralax prescription has been sent in to the patient's pharmacy today. Colonoscopy instructions have been reviewed with the patient. This patient is aware to call the office if they have further questions.   Dominga Ferry, CMA

## 2017-01-18 NOTE — Patient Instructions (Signed)
Colonoscopy, Adult A colonoscopy is an exam to look at the entire large intestine. During the exam, a lubricated, bendable tube is inserted into the anus and then passed into the rectum, colon, and other parts of the large intestine. A colonoscopy is often done as a part of normal colorectal screening or in response to certain symptoms, such as anemia, persistent diarrhea, abdominal pain, and blood in the stool. The exam can help screen for and diagnose medical problems, including:  Tumors.  Polyps.  Inflammation.  Areas of bleeding.  Tell a health care provider about:  Any allergies you have.  All medicines you are taking, including vitamins, herbs, eye drops, creams, and over-the-counter medicines.  Any problems you or family members have had with anesthetic medicines.  Any blood disorders you have.  Any surgeries you have had.  Any medical conditions you have.  Any problems you have had passing stool. What are the risks? Generally, this is a safe procedure. However, problems may occur, including:  Bleeding.  A tear in the intestine.  A reaction to medicines given during the exam.  Infection (rare).  What happens before the procedure? Eating and drinking restrictions Follow instructions from your health care provider about eating and drinking, which may include:  A few days before the procedure - follow a low-fiber diet. Avoid nuts, seeds, dried fruit, raw fruits, and vegetables.  1-3 days before the procedure - follow a clear liquid diet. Drink only clear liquids, such as clear broth or bouillon, black coffee or tea, clear juice, clear soft drinks or sports drinks, gelatin dessert, and popsicles. Avoid any liquids that contain red or purple dye.  On the day of the procedure - do not eat or drink anything during the 2 hours before the procedure, or within the time period that your health care provider recommends.  Bowel prep If you were prescribed an oral bowel prep  to clean out your colon:  Take it as told by your health care provider. Starting the day before your procedure, you will need to drink a large amount of medicated liquid. The liquid will cause you to have multiple loose stools until your stool is almost clear or light green.  If your skin or anus gets irritated from diarrhea, you may use these to relieve the irritation: ? Medicated wipes, such as adult wet wipes with aloe and vitamin E. ? A skin soothing-product like petroleum jelly.  If you vomit while drinking the bowel prep, take a break for up to 60 minutes and then begin the bowel prep again. If vomiting continues and you cannot take the bowel prep without vomiting, call your health care provider.  General instructions  Ask your health care provider about changing or stopping your regular medicines. This is especially important if you are taking diabetes medicines or blood thinners.  Plan to have someone take you home from the hospital or clinic. What happens during the procedure?  An IV tube may be inserted into one of your veins.  You will be given medicine to help you relax (sedative).  To reduce your risk of infection: ? Your health care team will wash or sanitize their hands. ? Your anal area will be washed with soap.  You will be asked to lie on your side with your knees bent.  Your health care provider will lubricate a long, thin, flexible tube. The tube will have a camera and a light on the end.  The tube will be inserted into your   anus.  The tube will be gently eased through your rectum and colon.  Air will be delivered into your colon to keep it open. You may feel some pressure or cramping.  The camera will be used to take images during the procedure.  A small tissue sample may be removed from your body to be examined under a microscope (biopsy). If any potential problems are found, the tissue will be sent to a lab for testing.  If small polyps are found, your  health care provider may remove them and have them checked for cancer cells.  The tube that was inserted into your anus will be slowly removed. The procedure may vary among health care providers and hospitals. What happens after the procedure?  Your blood pressure, heart rate, breathing rate, and blood oxygen level will be monitored until the medicines you were given have worn off.  Do not drive for 24 hours after the exam.  You may have a small amount of blood in your stool.  You may pass gas and have mild abdominal cramping or bloating due to the air that was used to inflate your colon during the exam.  It is up to you to get the results of your procedure. Ask your health care provider, or the department performing the procedure, when your results will be ready. This information is not intended to replace advice given to you by your health care provider. Make sure you discuss any questions you have with your health care provider. Document Released: 02/28/2000 Document Revised: 01/01/2016 Document Reviewed: 05/14/2015 Elsevier Interactive Patient Education  2018 [employer]  

## 2017-01-19 ENCOUNTER — Encounter: Payer: Self-pay | Admitting: General Surgery

## 2017-01-21 DIAGNOSIS — M9903 Segmental and somatic dysfunction of lumbar region: Secondary | ICD-10-CM | POA: Diagnosis not present

## 2017-01-21 DIAGNOSIS — M5136 Other intervertebral disc degeneration, lumbar region: Secondary | ICD-10-CM | POA: Diagnosis not present

## 2017-01-21 DIAGNOSIS — M62830 Muscle spasm of back: Secondary | ICD-10-CM | POA: Diagnosis not present

## 2017-01-21 DIAGNOSIS — M9901 Segmental and somatic dysfunction of cervical region: Secondary | ICD-10-CM | POA: Diagnosis not present

## 2017-02-17 DIAGNOSIS — M9901 Segmental and somatic dysfunction of cervical region: Secondary | ICD-10-CM | POA: Diagnosis not present

## 2017-02-17 DIAGNOSIS — M9903 Segmental and somatic dysfunction of lumbar region: Secondary | ICD-10-CM | POA: Diagnosis not present

## 2017-02-17 DIAGNOSIS — M62830 Muscle spasm of back: Secondary | ICD-10-CM | POA: Diagnosis not present

## 2017-02-17 DIAGNOSIS — M5136 Other intervertebral disc degeneration, lumbar region: Secondary | ICD-10-CM | POA: Diagnosis not present

## 2017-02-23 ENCOUNTER — Other Ambulatory Visit: Payer: Self-pay | Admitting: General Surgery

## 2017-02-23 DIAGNOSIS — Z86010 Personal history of colonic polyps: Secondary | ICD-10-CM

## 2017-02-24 ENCOUNTER — Encounter
Admission: RE | Disposition: A | Discharge status: Home / Self Care | Payer: Self-pay | Source: Ambulatory Visit | Attending: General Surgery

## 2017-02-24 ENCOUNTER — Ambulatory Visit: Payer: BLUE CROSS/BLUE SHIELD | Admitting: Anesthesiology

## 2017-02-24 ENCOUNTER — Encounter: Payer: Self-pay | Admitting: *Deleted

## 2017-02-24 ENCOUNTER — Ambulatory Visit
Admission: RE | Admit: 2017-02-24 | Discharge: 2017-02-24 | Disposition: A | Discharge status: Home / Self Care | Payer: BLUE CROSS/BLUE SHIELD | Source: Ambulatory Visit | Attending: General Surgery | Admitting: General Surgery

## 2017-02-24 DIAGNOSIS — D124 Benign neoplasm of descending colon: Secondary | ICD-10-CM | POA: Insufficient documentation

## 2017-02-24 DIAGNOSIS — Z8371 Family history of colonic polyps: Secondary | ICD-10-CM | POA: Diagnosis not present

## 2017-02-24 DIAGNOSIS — Z79899 Other long term (current) drug therapy: Secondary | ICD-10-CM | POA: Insufficient documentation

## 2017-02-24 DIAGNOSIS — Z86010 Personal history of colonic polyps: Secondary | ICD-10-CM | POA: Diagnosis not present

## 2017-02-24 DIAGNOSIS — E669 Obesity, unspecified: Secondary | ICD-10-CM | POA: Insufficient documentation

## 2017-02-24 DIAGNOSIS — Z1211 Encounter for screening for malignant neoplasm of colon: Secondary | ICD-10-CM | POA: Insufficient documentation

## 2017-02-24 DIAGNOSIS — Z800 Family history of malignant neoplasm of digestive organs: Secondary | ICD-10-CM | POA: Insufficient documentation

## 2017-02-24 DIAGNOSIS — Z6841 Body Mass Index (BMI) 40.0 and over, adult: Secondary | ICD-10-CM | POA: Insufficient documentation

## 2017-02-24 DIAGNOSIS — K635 Polyp of colon: Secondary | ICD-10-CM | POA: Diagnosis not present

## 2017-02-24 HISTORY — PX: COLONOSCOPY WITH PROPOFOL: SHX5780

## 2017-02-24 SURGERY — COLONOSCOPY WITH PROPOFOL
Anesthesia: General

## 2017-02-24 MED ORDER — PROPOFOL 500 MG/50ML IV EMUL
INTRAVENOUS | Status: AC
  Filled 2017-02-24: qty 50

## 2017-02-24 MED ORDER — PROPOFOL 500 MG/50ML IV EMUL
INTRAVENOUS | Status: DC | PRN
  Administered 2017-02-24: 125 ug/kg/min via INTRAVENOUS

## 2017-02-24 MED ORDER — SODIUM CHLORIDE 0.9 % IV SOLN
INTRAVENOUS | Status: DC | PRN
  Administered 2017-02-24: 11:00:00 via INTRAVENOUS

## 2017-02-24 MED ORDER — LIDOCAINE HCL (CARDIAC) 20 MG/ML IV SOLN
INTRAVENOUS | Status: DC | PRN
  Administered 2017-02-24: 50 mg via INTRAVENOUS

## 2017-02-24 MED ORDER — PROPOFOL 10 MG/ML IV BOLUS
INTRAVENOUS | Status: DC | PRN
  Administered 2017-02-24: 80 mg via INTRAVENOUS

## 2017-02-24 MED ORDER — SODIUM CHLORIDE 0.9 % IV SOLN
INTRAVENOUS | Status: DC
  Administered 2017-02-24: 10:00:00 via INTRAVENOUS

## 2017-02-24 NOTE — H&P (Signed)
John York is an 57 y.o. male.   Chief Complaint: Here for colonoscopy HPI: 56 yr old male with FH of colon CA and personal history of colon polyp-due for surveillance colonoscopy. No GI complaints. No interval changes since OV of 01/18/17  Past Medical History:  Diagnosis Date  . Family history of malignant neoplasm of gastrointestinal tract   . Personal history of colonic polyps 2013  . Screening for obesity   . Special screening for malignant neoplasms, colon 2013    Past Surgical History:  Procedure Laterality Date  . COLONOSCOPY  02/03/2012   Dr. Jamal Collin  . COLONOSCOPY W/ POLYPECTOMY  2008   85mm size sessile non bleeding polyp was found in the sigmoid colon    Family History  Problem Relation Age of Onset  . Cancer Sister        breast  . Colon cancer Brother    Social History:  reports that  has never smoked. he has never used smokeless tobacco. He reports that he does not drink alcohol or use drugs.  Allergies: No Known Allergies  Medications Prior to Admission  Medication Sig Dispense Refill  . cholecalciferol (VITAMIN D) 1000 units tablet Take 1,000 Units daily by mouth.      No results found for this or any previous visit (from the past 48 hour(s)). No results found.  Review of Systems  Constitutional: Negative.   Respiratory: Negative.   Cardiovascular: Negative.   Gastrointestinal: Negative.   Genitourinary: Negative.     Blood pressure (!) 159/94, pulse 65, temperature (!) 96.1 F (35.6 C), temperature source Tympanic, resp. rate 20, height 5\' 9"  (1.753 m), weight 300 lb (136.1 kg), SpO2 100 %. Physical Exam  Constitutional: He is oriented to person, place, and time. He appears well-nourished.  Eyes: Conjunctivae are normal. No scleral icterus.  Neck: Neck supple.  Cardiovascular: Normal rate, regular rhythm and normal heart sounds.  Respiratory: Effort normal and breath sounds normal.  GI: Soft. Bowel sounds are normal. He exhibits no  distension and no mass.  Lymphadenopathy:    He has no cervical adenopathy.  Neurological: He is alert and oriented to person, place, and time.  Skin: Skin is warm and dry.     Assessment/Plan Proceed with colonoscopy as planned  Christene Lye, MD 02/24/2017, 11:03 AM

## 2017-02-24 NOTE — Transfer of Care (Signed)
Immediate Anesthesia Transfer of Care Note  Patient: TREVELL PARISEAU  Procedure(s) Performed: COLONOSCOPY WITH PROPOFOL (N/A )  Patient Location: Endoscopy Unit  Anesthesia Type:General  Level of Consciousness: sedated  Airway & Oxygen Therapy: Patient Spontanous Breathing and Patient connected to nasal cannula oxygen  Post-op Assessment: Report given to RN and Post -op Vital signs reviewed and stable  Post vital signs: Reviewed and stable  Last Vitals:  Vitals:   02/24/17 1000 02/24/17 1140  BP: (!) 159/94 118/74  Pulse: 65 76  Resp: 20 20  Temp: (!) 35.6 C (!) 36.1 C  SpO2: 100% 100%    Last Pain:  Vitals:   02/24/17 1140  TempSrc: Tympanic         Complications: No apparent anesthesia complications

## 2017-02-24 NOTE — Interval H&P Note (Signed)
History and Physical Interval Note:  02/24/2017 11:06 AM  John York  has presented today for surgery, with the diagnosis of FH COLON POLYPS PH COLON POLYPS  The various methods of treatment have been discussed with the patient and family. After consideration of risks, benefits and other options for treatment, the patient has consented to  Procedure(s): COLONOSCOPY WITH PROPOFOL (N/A) as a surgical intervention .  The patient's history has been reviewed, patient examined, no change in status, stable for surgery.  I have reviewed the patient's chart and labs.  Questions were answered to the patient's satisfaction.     SANKAR,SEEPLAPUTHUR G

## 2017-02-24 NOTE — Op Note (Signed)
[facility] Gastroenterology Patient Name: John York Procedure Date: 02/24/2017 11:09 AM MRN: [id number] Account #: [id number] Date of Birth: May 30, 1960 Admit Type: Outpatient Age: 56 Room: [facility] ENDO ROOM 1 Gender: Male Note Status: Finalized Procedure:            Colonoscopy Indications:          High risk colon cancer surveillance: Personal history                        of non-advanced adenoma Providers:            Seeplaputhur G. Jamal Collin, MD Medicines:            General Anesthesia Complications:        No immediate complications. Procedure:            Pre-Anesthesia Assessment:                       - General anesthesia under the supervision of an                        anesthesiologist was determined to be medically                        necessary for this procedure based on review of the                        patient's medical history, medications, and prior                        anesthesia history.                       After obtaining informed consent, the colonoscope was                        passed under direct vision. Throughout the procedure,                        the patient's blood pressure, pulse, and oxygen                        saturations were monitored continuously. The                        Colonoscope was introduced through the anus and                        advanced to the the cecum, identified by the ileocecal                        valve. The colonoscopy was performed without                        difficulty. The patient tolerated the procedure well.                        The quality of the bowel preparation was good. Findings:      The perianal and digital rectal examinations were normal.      A 3 mm polyp was found in the descending colon. The polyp was sessile.  The polyp was removed with a cold snare. Resection and retrieval were       complete.      The exam was otherwise without abnormality on direct and  retroflexion       views. Impression:           - One 3 mm polyp in the descending colon, removed with                        a cold snare. Resected and retrieved.                       - The examination was otherwise normal on direct and                        retroflexion views. Recommendation:       - Discharge patient to home.                       - Resume previous diet.                       - Continue present medications.                       - Await pathology results.                       - Repeat colonoscopy in 5 years for surveillance. Procedure Code(s):    --- Professional ---                       [phone], Colonoscopy, flexible; with removal of tumor(s),                        polyp(s), or other lesion(s) by snare technique Diagnosis Code(s):    --- Professional ---                       Z86.010, Personal history of colonic polyps                       D12.4, Benign neoplasm of descending colon CPT copyright 2016 American [employer] Association. All rights reserved. The codes documented in this report are preliminary and upon coder review may  be revised to meet current compliance requirements. Christene Lye, MD 02/24/2017 11:44:01 AM This report has been signed electronically. Number of Addenda: 0 Note Initiated On: 02/24/2017 11:09 AM Scope Withdrawal Time: 0 hours 4 minutes 34 seconds  Total Procedure Duration: 0 hours 27 minutes 16 seconds       [facility]

## 2017-02-24 NOTE — Anesthesia Post-op Follow-up Note (Signed)
Anesthesia QCDR form completed.        

## 2017-02-24 NOTE — Anesthesia Postprocedure Evaluation (Signed)
Anesthesia Post Note  Patient: John York  Procedure(s) Performed: COLONOSCOPY WITH PROPOFOL (N/A )  Patient location during evaluation: Endoscopy Anesthesia Type: General Level of consciousness: awake and alert and oriented Pain management: pain level controlled Vital Signs Assessment: post-procedure vital signs reviewed and stable Respiratory status: spontaneous breathing, nonlabored ventilation and respiratory function stable Cardiovascular status: blood pressure returned to baseline and stable Postop Assessment: no signs of nausea or vomiting Anesthetic complications: no     Last Vitals:  Vitals:   02/24/17 1200 02/24/17 1210  BP: 115/74 125/84  Pulse: 60 60  Resp: 20 13  Temp:    SpO2:  100%    Last Pain:  Vitals:   02/24/17 1140  TempSrc: Tympanic                 Terilynn Buresh

## 2017-02-24 NOTE — Anesthesia Preprocedure Evaluation (Signed)
Anesthesia Evaluation  Patient identified by MRN, date of birth, ID band Patient awake    Reviewed: Allergy & Precautions, NPO status , Patient's Chart, lab work & pertinent test results  History of Anesthesia Complications Negative for: history of anesthetic complications  Airway Mallampati: III  TM Distance: >3 FB Neck ROM: Full    Dental no notable dental hx.    Pulmonary neg pulmonary ROS, neg sleep apnea, neg COPD,    breath sounds clear to auscultation- rhonchi (-) wheezing      Cardiovascular Exercise Tolerance: Good (-) hypertension(-) CAD and (-) Past MI  Rhythm:Regular Rate:Normal - Systolic murmurs and - Diastolic murmurs    Neuro/Psych negative neurological ROS  negative psych ROS   GI/Hepatic negative GI ROS, Neg liver ROS,   Endo/Other  negative endocrine ROSneg diabetes  Renal/GU negative Renal ROS     Musculoskeletal negative musculoskeletal ROS (+)   Abdominal (+) + obese,   Peds  Hematology negative hematology ROS (+)   Anesthesia Other Findings Past Medical History: No date: Family history of malignant neoplasm of gastrointestinal  tract 2013: Personal history of colonic polyps No date: Screening for obesity 2013: Special screening for malignant neoplasms, colon   Reproductive/Obstetrics                             Anesthesia Physical Anesthesia Plan  ASA: II  Anesthesia Plan: General   Post-op Pain Management:    Induction: Intravenous  PONV Risk Score and Plan: 1 and Propofol infusion  Airway Management Planned: Natural Airway  Additional Equipment:   Intra-op Plan:   Post-operative Plan:   Informed Consent: I have reviewed the patients History and Physical, chart, labs and discussed the procedure including the risks, benefits and alternatives for the proposed anesthesia with the patient or authorized representative who has indicated his/her  understanding and acceptance.   Dental advisory given  Plan Discussed with: CRNA and Anesthesiologist  Anesthesia Plan Comments:         Anesthesia Quick Evaluation

## 2017-02-25 LAB — SURGICAL PATHOLOGY

## 2017-02-26 ENCOUNTER — Telehealth: Payer: Self-pay

## 2017-02-26 NOTE — Telephone Encounter (Signed)
-----   Message from Christene Lye, MD sent at 02/26/2017  9:38 AM EST ----- Please inform pt-path benign adenoma

## 2017-03-01 ENCOUNTER — Encounter: Payer: Self-pay | Admitting: General Surgery

## 2017-03-01 DIAGNOSIS — H538 Other visual disturbances: Secondary | ICD-10-CM | POA: Diagnosis not present

## 2017-03-01 NOTE — Telephone Encounter (Signed)
Notified patient as instructed, patient pleased. Discussed follow-up appointments, patient agrees  

## 2017-03-03 DIAGNOSIS — H539 Unspecified visual disturbance: Secondary | ICD-10-CM | POA: Diagnosis not present

## 2017-03-03 DIAGNOSIS — M9901 Segmental and somatic dysfunction of cervical region: Secondary | ICD-10-CM | POA: Diagnosis not present

## 2017-03-03 DIAGNOSIS — M62830 Muscle spasm of back: Secondary | ICD-10-CM | POA: Diagnosis not present

## 2017-03-03 DIAGNOSIS — M9903 Segmental and somatic dysfunction of lumbar region: Secondary | ICD-10-CM | POA: Diagnosis not present

## 2017-03-03 DIAGNOSIS — M5136 Other intervertebral disc degeneration, lumbar region: Secondary | ICD-10-CM | POA: Diagnosis not present

## 2017-03-15 DIAGNOSIS — J209 Acute bronchitis, unspecified: Secondary | ICD-10-CM | POA: Diagnosis not present

## 2017-03-15 DIAGNOSIS — B9689 Other specified bacterial agents as the cause of diseases classified elsewhere: Secondary | ICD-10-CM | POA: Diagnosis not present

## 2017-03-15 DIAGNOSIS — J019 Acute sinusitis, unspecified: Secondary | ICD-10-CM | POA: Diagnosis not present

## 2017-06-16 ENCOUNTER — Encounter: Payer: Self-pay | Admitting: Adult Health

## 2017-06-16 ENCOUNTER — Ambulatory Visit: Payer: BLUE CROSS/BLUE SHIELD | Admitting: Adult Health

## 2017-06-16 VITALS — BP 139/75 | HR 87 | Temp 99.8°F | Resp 16 | Ht 69.0 in | Wt 312.0 lb

## 2017-06-16 DIAGNOSIS — J3089 Other allergic rhinitis: Secondary | ICD-10-CM

## 2017-06-16 DIAGNOSIS — H6691 Otitis media, unspecified, right ear: Secondary | ICD-10-CM

## 2017-06-16 DIAGNOSIS — H9319 Tinnitus, unspecified ear: Secondary | ICD-10-CM | POA: Insufficient documentation

## 2017-06-16 MED ORDER — AZITHROMYCIN 250 MG PO TABS: ORAL_TABLET | ORAL | 0 refills | Status: DC

## 2017-06-16 MED ORDER — CETIRIZINE HCL 10 MG PO TABS: 10.0000 mg | ORAL_TABLET | Freq: Every day | ORAL | 11 refills | Status: DC

## 2017-06-16 MED ORDER — FLUTICASONE PROPIONATE 50 MCG/ACT NA SUSP: 2.0000 | Freq: Every day | NASAL | 1 refills | Status: DC

## 2017-06-16 NOTE — Patient Instructions (Addendum)
Tinnitus Tinnitus refers to hearing a sound when there is no actual source for that sound. This is often described as ringing in the ears. However, people with this condition may hear a variety of noises. A person may hear the sound in one ear or in both ears. The sounds of tinnitus can be soft, loud, or somewhere in between. Tinnitus can last for a few seconds or can be constant for days. It may go away without treatment and come back at various times. When tinnitus is constant or happens often, it can lead to other problems, such as trouble sleeping and trouble concentrating. Almost everyone experiences tinnitus at some point. Tinnitus that is long-lasting (chronic) or comes back often is a problem that may require medical attention. What are the causes? The cause of tinnitus is often not known. In some cases, it can result from other problems or conditions, including:  Exposure to loud noises from machinery, music, or other sources.  Hearing loss.  Ear or sinus infections.  Earwax buildup.  A foreign object in the ear.  Use of certain medicines.  Use of alcohol and caffeine.  High blood pressure.  Heart diseases.  Anemia.  Allergies.  Meniere disease.  Thyroid problems.  Tumors.  An enlarged part of a weakened blood vessel (aneurysm).  What are the signs or symptoms? The main symptom of tinnitus is hearing a sound when there is no source for that sound. It may sound like:  Buzzing.  Roaring.  Ringing.  Blowing air, similar to the sound heard when you listen to a seashell.  Hissing.  Whistling.  Sizzling.  Humming.  Running water.  A sustained musical note.  How is this diagnosed? Tinnitus is diagnosed based on your symptoms. Your health care provider will do a physical exam. A comprehensive hearing exam (audiologic exam) will be done if your tinnitus:  Affects only one ear (unilateral).  Causes hearing difficulties.  Lasts 6 months or  longer.  You may also need to see a health care provider who specializes in hearing disorders (audiologist). You may be asked to complete a questionnaire to determine the severity of your tinnitus. Tests may be done to help determine the cause and to rule out other conditions. These can include:  Imaging studies of your head and brain, such as: ? A CT scan. ? An MRI.  An imaging study of your blood vessels (angiogram).  How is this treated? Treating an underlying medical condition can sometimes make tinnitus go away. If your tinnitus continues, other treatments may include:  Medicines, such as certain antidepressants or sleeping aids.  Sound generators to mask the tinnitus. These include: ? Tabletop sound machines that play relaxing sounds to help you fall asleep. ? Wearable devices that fit in your ear and play sounds or music. ? A small device that uses headphones to deliver a signal embedded in music (acoustic neural stimulation). In time, this may change the pathways of your brain and make you less sensitive to tinnitus. This device is used for very severe cases when no other treatment is working.  Therapy and counseling to help you manage the stress of living with tinnitus.  Using hearing aids or cochlear implants, if your tinnitus is related to hearing loss.  Follow these instructions at home:  When possible, avoid being in loud places and being exposed to loud sounds.  Wear hearing protection, such as earplugs, when you are exposed to loud noises.  Do not take stimulants, such as nicotine,   alcohol, or caffeine.  Practice techniques for reducing stress, such as meditation, yoga, or deep breathing.  Use a white noise machine, a humidifier, or other devices to mask the sound of tinnitus.  Sleep with your head slightly raised. This may reduce the impact of tinnitus.  Try to get plenty of rest each night. Contact a health care provider if:  You have tinnitus in just one  ear.  Your tinnitus continues for 3 weeks or longer without stopping.  Home care measures are not helping.  You have tinnitus after a head injury.  You have tinnitus along with any of the following: ? Dizziness. ? Loss of balance. ? Nausea and vomiting. This information is not intended to replace advice given to you by your health care provider. Make sure you discuss any questions you have with your health care provider. Document Released: 03/02/2005 Document Revised: 11/03/2015 Document Reviewed: 08/02/2013 Elsevier Interactive Patient Education  2018 [employer]. Allergies An allergy is when your body reacts to a substance in a way that is not normal. An allergic reaction can happen after you:  Eat something.  Breathe in something.  Touch something.  You can be allergic to:  Things that are only around during certain seasons, like molds and pollens.  Foods.  Drugs.  Insects.  Animal dander.  What are the signs or symptoms?  Puffiness (swelling). This may happen on the lips, face, tongue, mouth, or throat.  Sneezing.  Coughing.  Breathing loudly (wheezing).  Stuffy nose.  Tingling in the mouth.  A rash.  Itching.  Itchy, red, puffy areas of skin (hives).  Watery eyes.  Throwing up (vomiting).  Watery poop (diarrhea).  Dizziness.  Feeling faint or fainting.  Trouble breathing or swallowing.  A tight feeling in the chest.  A fast heartbeat. How is this diagnosed? Allergies can be diagnosed with:  A medical and family history.  Skin tests.  Blood tests.  A food diary. A food diary is a record of all the foods, drinks, and symptoms you have each day.  The results of an elimination diet. This diet involves making sure not to eat certain foods and then seeing what happens when you start eating them again.  How is this treated? There is no cure for allergies, but allergic reactions can be treated with medicine. Severe reactions usually  need to be treated at a hospital. How is this prevented? The best way to prevent an allergic reaction is to avoid the thing you are allergic to. Allergy shots and medicines can also help prevent reactions in some cases. This information is not intended to replace advice given to you by your health care provider. Make sure you discuss any questions you have with your health care provider. Document Released: 06/27/2012 Document Revised: 10/28/2015 Document Reviewed: 12/12/2013 Elsevier Interactive Patient Education  2018 [employer].  Otitis Media, Adult Otitis media is redness, soreness, and puffiness (swelling) in the space just behind your eardrum (middle ear). It may be caused by allergies or infection. It often happens along with a cold. Follow these instructions at home:  Take your medicine as told. Finish it even if you start to feel better.  Only take over-the-counter or prescription medicines for pain, discomfort, or fever as told by your doctor.  Follow up with your doctor as told. Contact a doctor if:  You have otitis media only in one ear, or bleeding from your nose, or both.  You notice a lump on your neck.  You  are not getting better in 3-5 days.  You feel worse instead of better. Get help right away if:  You have pain that is not helped with medicine.  You have puffiness, redness, or pain around your ear.  You get a stiff neck.  You cannot move part of your face (paralysis).  You notice that the bone behind your ear hurts when you touch it. This information is not intended to replace advice given to you by your health care provider. Make sure you discuss any questions you have with your health care provider. Document Released: 08/19/2007 Document Revised: 08/08/2015 Document Reviewed: 09/27/2012 Elsevier Interactive Patient Education  2017 [employer].

## 2017-06-16 NOTE — Progress Notes (Addendum)
Subjective:     Patient ID: John York, male   DOB: 12-10-1960, 57 y.o.   MRN: [id number]  HPI   Blood pressure 139/75, pulse 87, temperature 99.8 F (37.7 C), resp. rate 16, height 5\' 9"  (1.753 m), weight (!) 312 lb (141.5 kg), SpO2 98 %.  Patient is a 57 year old male in no acute distress who reports he started with post nasal drip congestion on this past Sunday 06/12/17. He reports he feels as if he may have had a mild fever on Sunday that resolved same day. He also reports that he has noticed increased allergy type symptoms over the past couple of years. He denies taking anything for allergies currently or in past.   Denies any recent antibiotics in December 2019 and reports his sinus infection cleared up on Augmentin " but it took a while to resolve".   He reports having 3 or more sinus infections yearly and also incidentally reports tinnitus for " multiple years " in his left ear,  occasionally hears in the right ear as well. Denies any headaches or head pressure. Denies any paresthesias.   Patient  denies any fever( none last two days) , body aches,chills, rash, chest pain, shortness of breath, nausea, vomiting, or diarrhea.     Body mass index is 46.07 kg/m.   No Known Allergies  Patient Active Problem List   Diagnosis Date Noted  . Annual physical exam 09/25/2014  . Obesity 09/25/2014  . FHx: migraine headaches 09/25/2014  . Clicking shoulder 82/95/6213  . Rotator cuff syndrome 04/16/2014  . Other specified joint disorders, unspecified shoulder 04/16/2014  . 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 Shoulder strain 03/21/2014  . Family history of malignant neoplasm of gastrointestinal tract   . Personal history of colonic polyps       Review of Systems  Constitutional: Negative for activity change, appetite change, chills, diaphoresis, fatigue, fever (none in past two days. ) and unexpected weight change.  HENT: Positive for congestion, postnasal drip, rhinorrhea,  sneezing and tinnitus (intermittent - reports has had for years - never seen ENT . ). Negative for dental problem, drooling, ear discharge, ear pain, facial swelling, hearing loss, mouth sores, nosebleeds, sinus pressure, sinus pain, sore throat, trouble swallowing and voice change.   Eyes: Positive for discharge and itching (at times especially when outside ). Negative for photophobia, pain, redness and visual disturbance.  Respiratory: Positive for cough. Negative for apnea, choking, chest tightness, shortness of breath, wheezing and stridor.   Cardiovascular: Negative.  Negative for chest pain, palpitations and leg swelling.  Gastrointestinal: Negative.   Endocrine: Negative.   Genitourinary: Negative.   Musculoskeletal: Negative.   Skin: Negative.   Allergic/Immunologic: Positive for environmental allergies (notices watery eyes and nasal discharge with grass- has had to stop mowing since last year. ).       Can not cut grass since last year as allergies bothered him - he denies taking antihistamine.   Neurological: Negative for dizziness, tremors, seizures, syncope, facial asymmetry, speech difficulty, weakness, light-headedness, numbness and headaches.  Hematological: Negative.   Psychiatric/Behavioral: Negative.        Objective:   Physical Exam  Constitutional: He is oriented to person, place, and time. Vital signs are normal. He appears well-developed and well-nourished. He is active.  Non-toxic appearance. He does not have a sickly appearance. He does not appear ill. No distress. He is not intubated.  HENT:  Head: Normocephalic and atraumatic.  Right Ear: Hearing, external ear and  ear canal normal. No drainage or tenderness. Tympanic membrane is erythematous. Tympanic membrane is not perforated. A middle ear effusion is present. No decreased hearing is noted.  Left Ear: Hearing and external ear normal. No drainage or tenderness. No decreased hearing is noted.  Nose: Mucosal edema and  rhinorrhea present. Right sinus exhibits no maxillary sinus tenderness and no frontal sinus tenderness. Left sinus exhibits no maxillary sinus tenderness and no frontal sinus tenderness.  Mouth/Throat: Uvula is midline and mucous membranes are normal. No uvula swelling. Posterior oropharyngeal erythema present. No oropharyngeal exudate, posterior oropharyngeal edema or tonsillar abscesses.  Unable to visualize left tympanic membrane as a soft brown wax impeding visualization. Canal appears normal. Patient denies any pain.    Cobblestoning posterior pharynx; bilateral allergic shiners.    Eyes: Pupils are equal, round, and reactive to light. Conjunctivae and EOM are normal. Right eye exhibits discharge (bilatearl watery eyes intermittently during exam assoicated with intermittent sneezing. ). Right eye exhibits no chemosis, no exudate and no hordeolum. No foreign body present in the right eye. Left eye exhibits discharge. Left eye exhibits no chemosis, no exudate and no hordeolum. No foreign body present in the left eye. Right conjunctiva is not injected. Right conjunctiva has no hemorrhage. Left conjunctiva is not injected. Left conjunctiva has no hemorrhage. No scleral icterus. Right eye exhibits normal extraocular motion and no nystagmus. Left eye exhibits normal extraocular motion and no nystagmus.  Bilateral watery clear discharge bilaterally see note.   Patient denies any eye pain, watery eyes worse when goes outside.    Neck: Trachea normal, normal range of motion and full passive range of motion without pain. Neck supple. Normal carotid pulses, no hepatojugular reflux and no JVD present. No tracheal tenderness present. Carotid bruit is not present. No tracheal deviation present. No Brudzinski's sign noted. No thyroid mass and no thyromegaly present.  Cardiovascular: Normal rate, regular rhythm, normal heart sounds and intact distal pulses. Exam reveals no gallop and no friction rub.  No murmur  heard. Pulmonary/Chest: Effort normal and breath sounds normal. No accessory muscle usage or stridor. No apnea, no tachypnea and no bradypnea. He is not intubated. No respiratory distress. He has no decreased breath sounds. He has no wheezes. He has no rales. He exhibits no tenderness.  Abdominal: Soft. Normal appearance and bowel sounds are normal.  Round/ obese   Musculoskeletal: Normal range of motion.  Patient moves on and off of exam table and in room without difficulty. Gait is normal in hall and in room. Patient is oriented to person place time and situation. Patient answers questions appropriately and engages in conversation.   Lymphadenopathy:       Head (right side): No submental, no submandibular, no tonsillar, no preauricular, no posterior auricular and no occipital adenopathy present.       Head (left side): No submental, no submandibular, no tonsillar, no preauricular, no posterior auricular and no occipital adenopathy present.    He has no cervical adenopathy.    He has no axillary adenopathy.  Neurological: He is alert and oriented to person, place, and time. He has normal strength. He displays no atrophy, no tremor and normal reflexes. No cranial nerve deficit or sensory deficit. He exhibits normal muscle tone. He displays a negative Romberg sign. He displays no seizure activity. Coordination and gait normal.  Skin: Skin is warm and dry. No rash noted. He is not diaphoretic. No cyanosis or erythema. No pallor. Nails show no clubbing.  Denies rash  Psychiatric: He has a normal mood and affect. His behavior is normal. Judgment and thought content normal.  Vitals reviewed.      Assessment:     Tinnitus, unspecified laterality - Plan: Ambulatory referral to ENT  Right otitis media, unspecified otitis media type - Plan: Ambulatory referral to ENT  Seasonal allergic rhinitis due to other allergic trigger    Plan:     Meds ordered this encounter  Medications  .  azithromycin (ZITHROMAX) 250 MG tablet    Sig: By mouth Take 2 tablets day 1 (500mg  total) and 1 tablet ( 250 mg ) days 2,3,4,5.    Dispense:  6 tablet    Refill:  0  . cetirizine (ZYRTEC) 10 MG tablet    Sig: Take 1 tablet (10 mg total) by mouth daily.    Dispense:  30 tablet    Refill:  11  . fluticasone (FLONASE) 50 MCG/ACT nasal spray    Sig: Place 2 sprays into both nostrils daily.    Dispense:  16 g    Refill:  1      Orders Placed This Encounter  Procedures  . Ambulatory referral to ENT    Referral Priority:   Routine    Referral Type:   Consultation    Referral Reason:   Specialty Services Required    Referred to Provider:   Beverly Gust, MD    Requested Specialty:   Otolaryngology    Number of Visits Requested:   1   Patient reports he does not have a PCP and it is recommended he be seen by PCP yearly- information below discussed: Will refer to ENT for evaluation given history.  Provider also recommends patient see primary care physician for a routine physical and to establish primary care. Patient may chose provider of choice. Also gave the [facility] at [phone] or web site at [email or web address] to help assist with finding a primary care doctor. Patient understands this office is acute care and no longer taking new primary care patients.  Advised patient call the office or your primary care doctor for an appointment if no improvement within 72 hours or if any symptoms change or worsen at any time  Advised ER or urgent Care if after hours or on weekend. Call 911 for emergency symptoms at any time.Patinet verbalized understanding of all instructions given/reviewed and treatment plan and has no further questions or concerns at this time.

## 2017-07-08 DIAGNOSIS — H9312 Tinnitus, left ear: Secondary | ICD-10-CM | POA: Diagnosis not present

## 2017-07-08 DIAGNOSIS — H6123 Impacted cerumen, bilateral: Secondary | ICD-10-CM | POA: Diagnosis not present

## 2017-09-03 DIAGNOSIS — H40003 Preglaucoma, unspecified, bilateral: Secondary | ICD-10-CM | POA: Diagnosis not present

## 2017-09-10 DIAGNOSIS — H40003 Preglaucoma, unspecified, bilateral: Secondary | ICD-10-CM | POA: Diagnosis not present

## 2017-11-02 IMAGING — CR DG SHOULDER 2+V*L*
1 series · 4 of 4 positions shown · non-contrast
Comparison: None

CLINICAL DATA: Left shoulder pain or greater than 1 year. No known
injury.

EXAM:
LEFT SHOULDER - 2+ VIEW

[Series 1: dg shoulder left · 0.14mm/px · 4 of 4 slices shown]
[im 1/4]
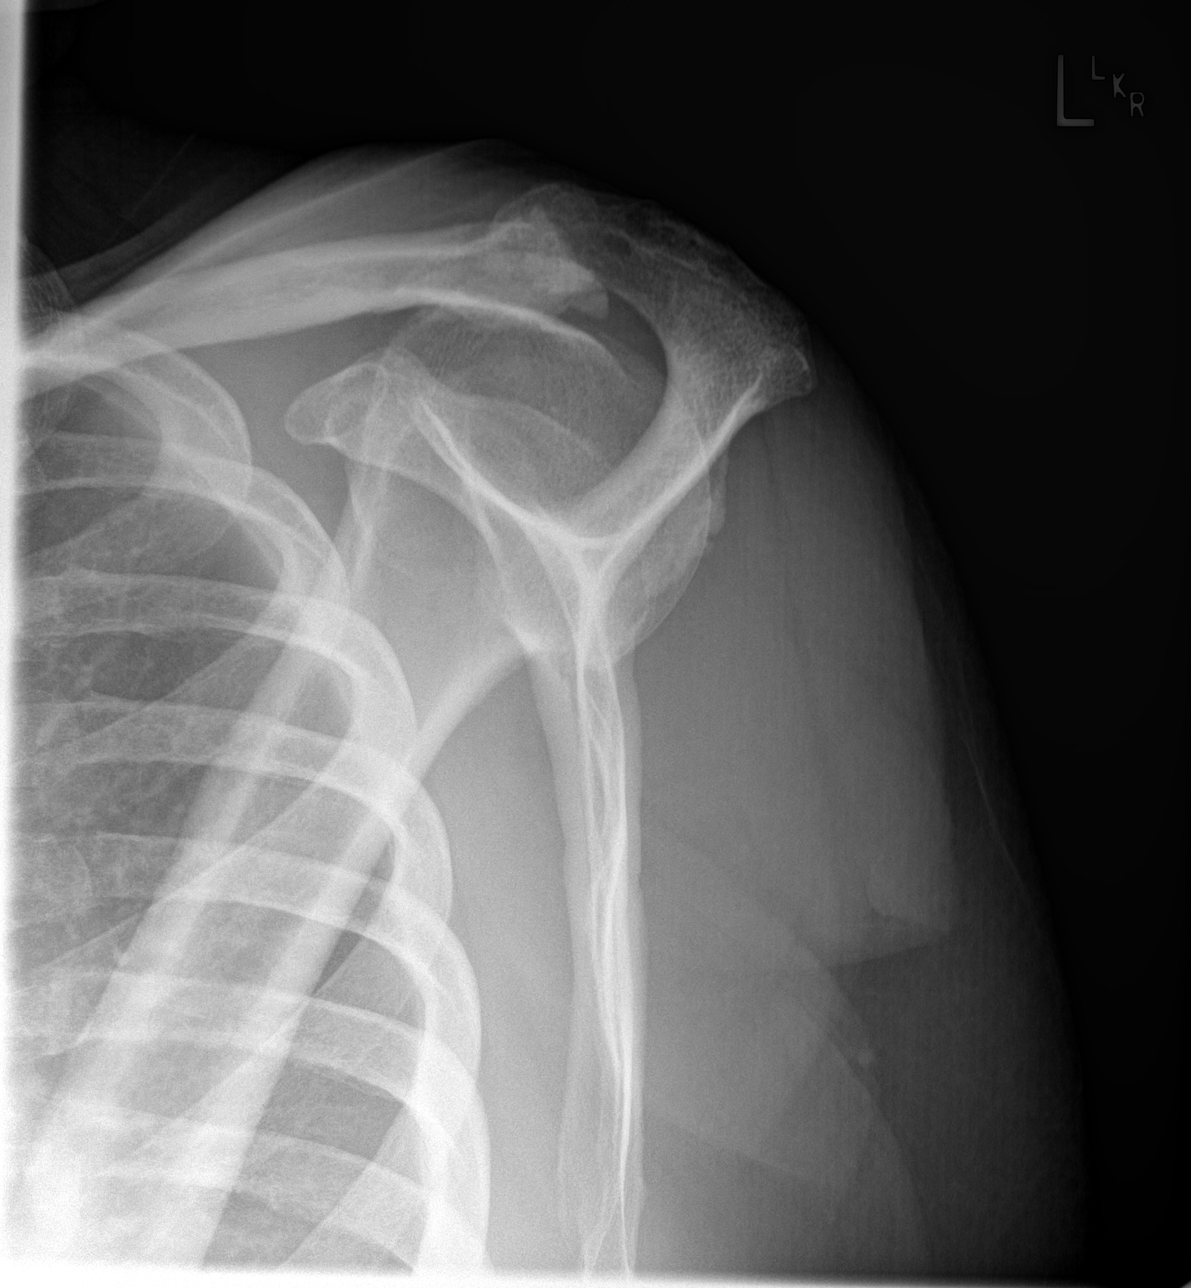
[im 2/4]
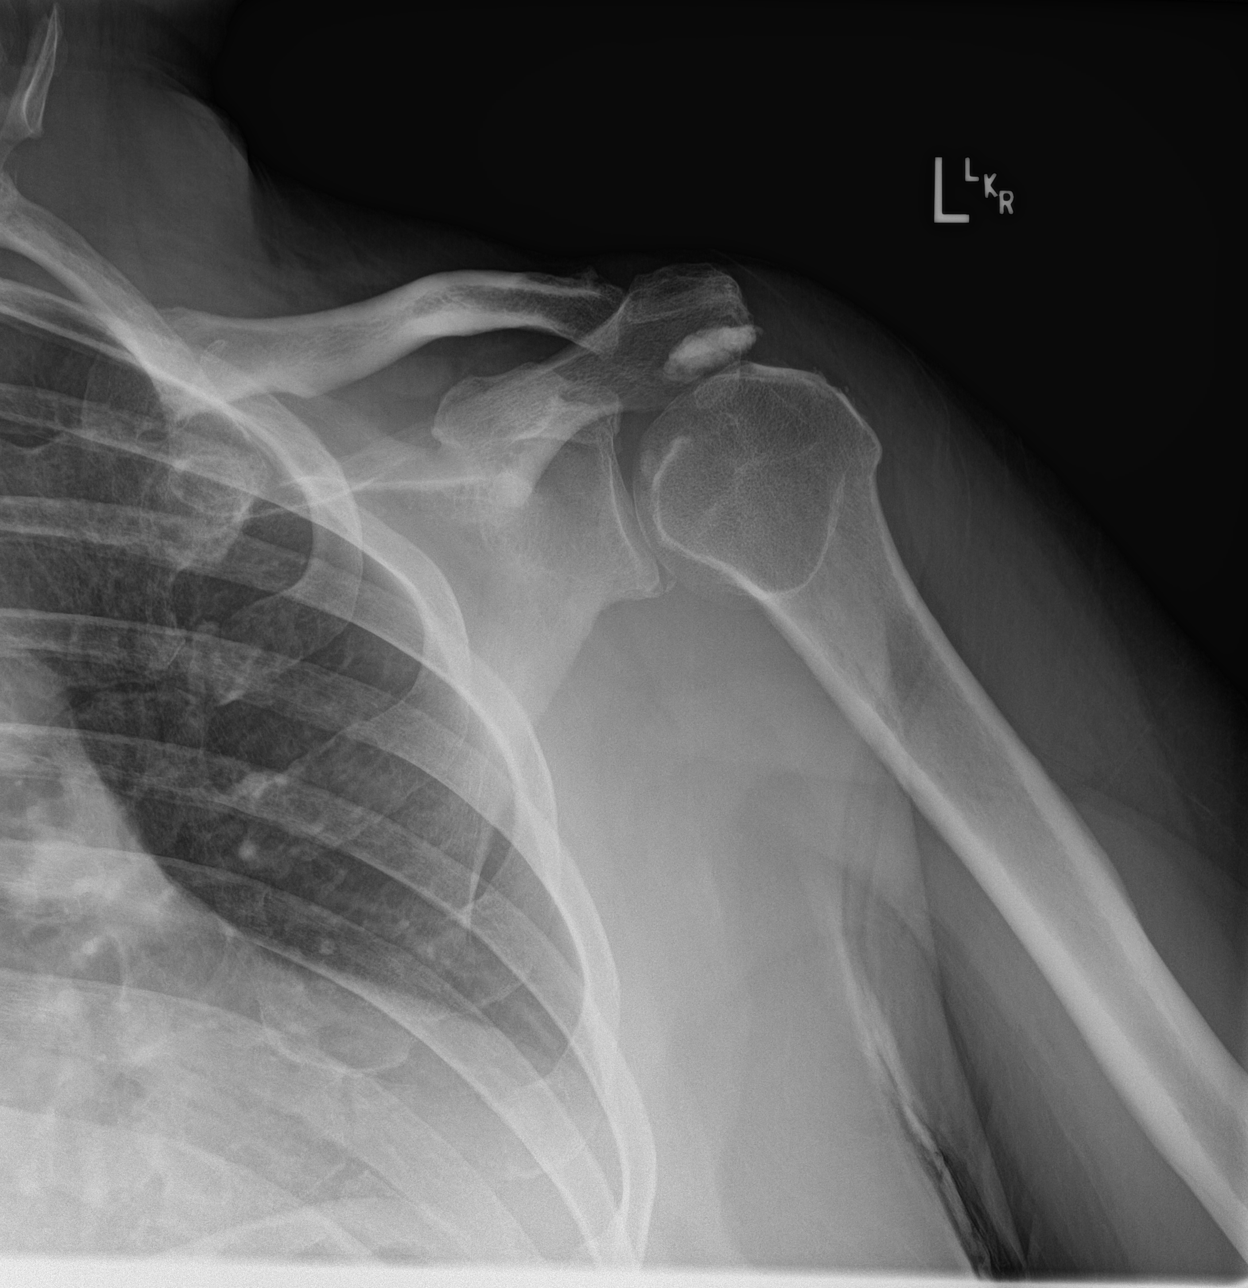
[im 3/4]
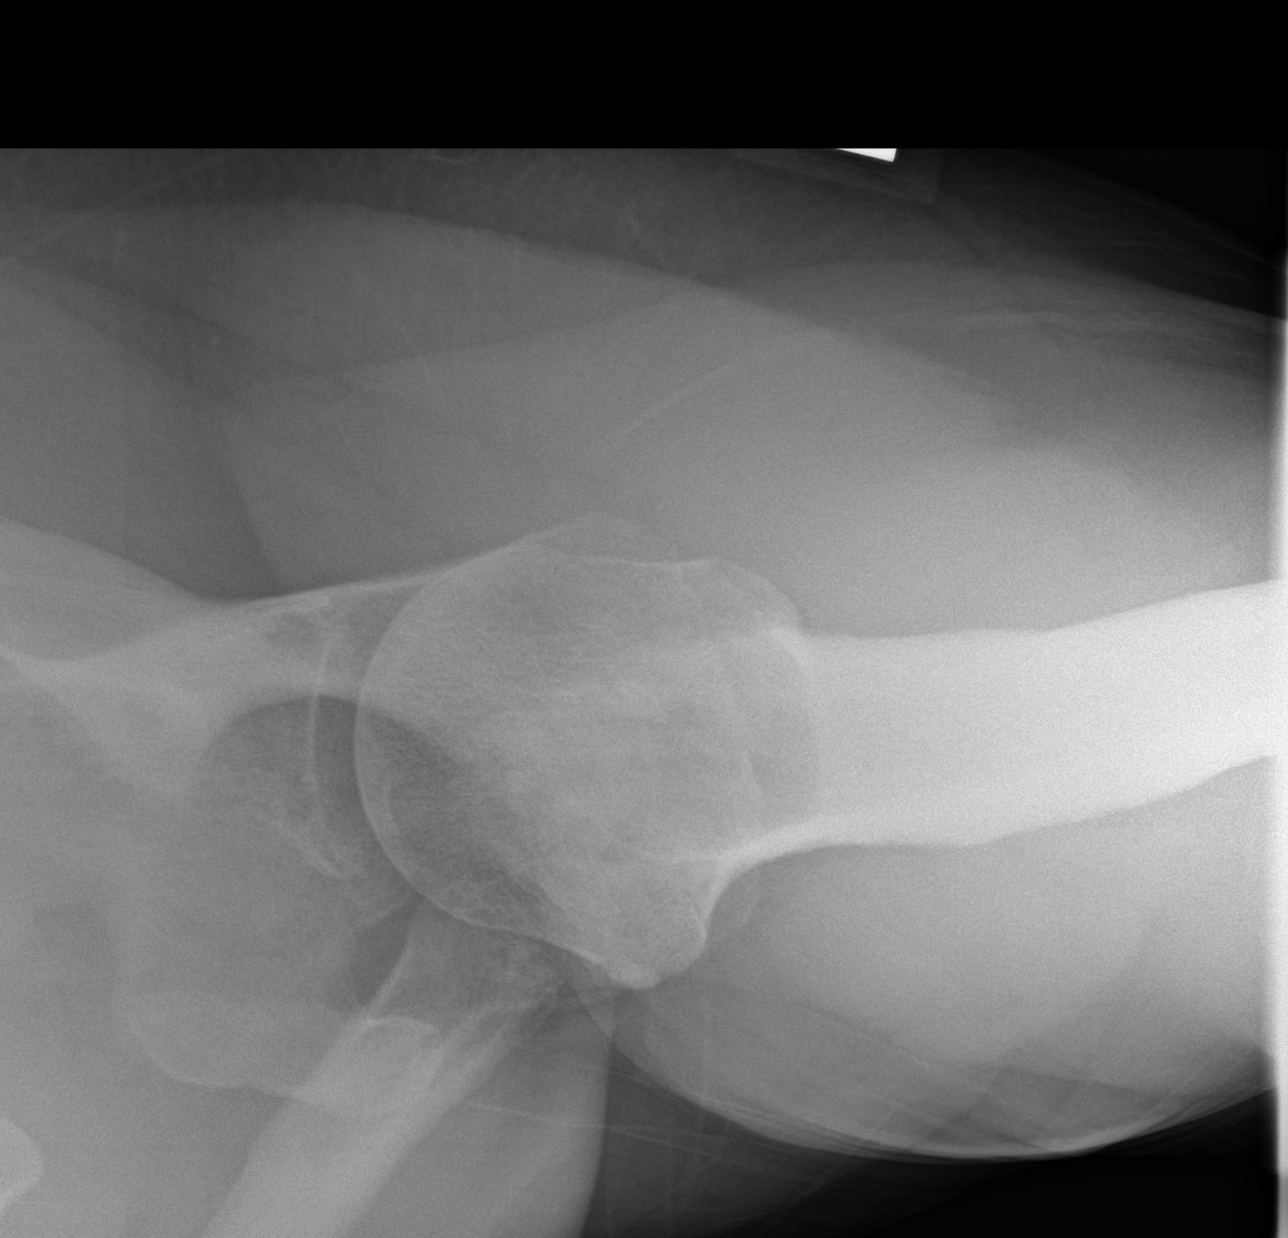
[im 4/4]
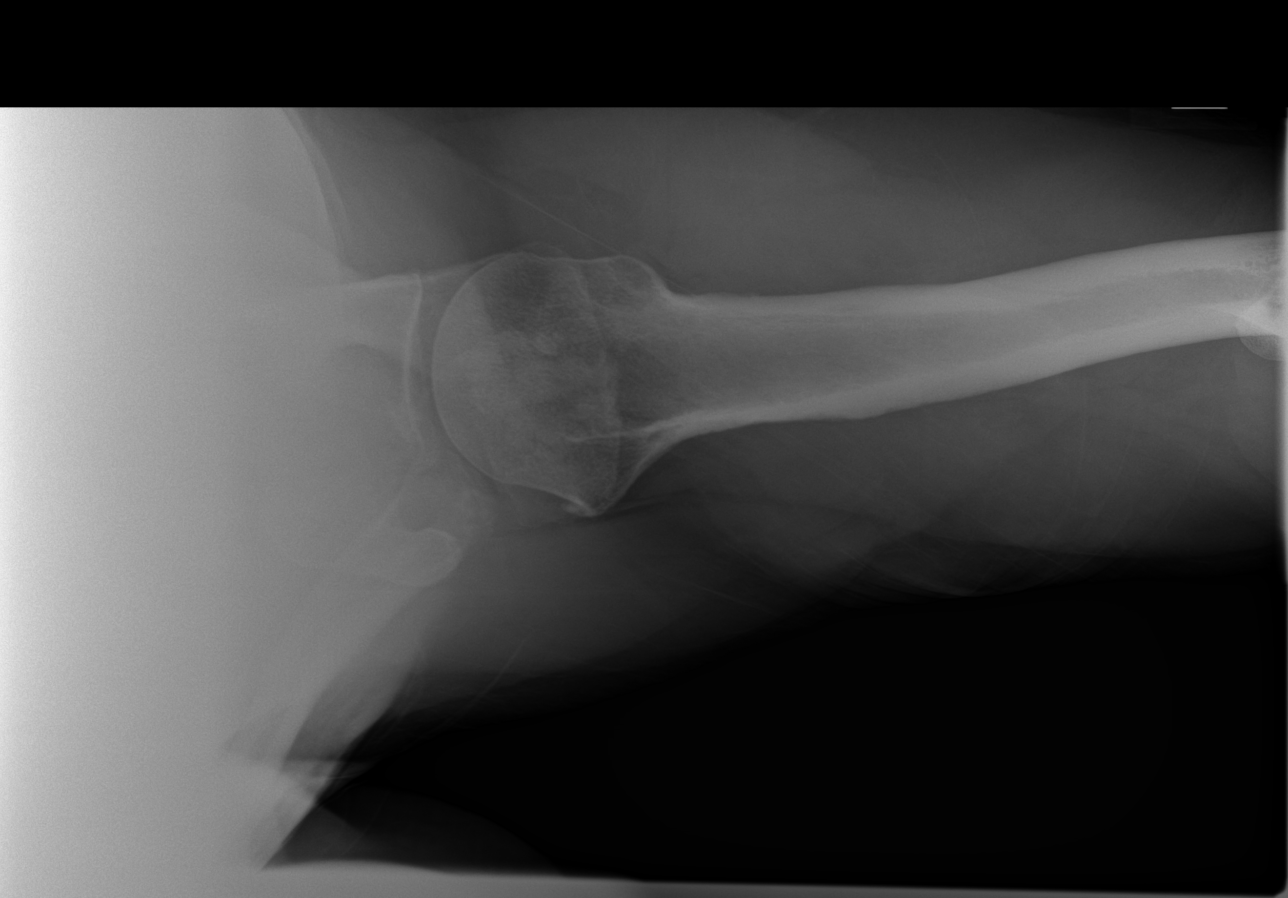

[4 of 4 positions shown; findings below may reference images not displayed]

FINDINGS: Degenerative changes in the left AC and glenohumeral joints with
joint space narrowing and spurring. Large area of calcification
within the subacromial space, likely in the rotator cuff tendon. No
fracture, subluxation or dislocation.
IMPRESSION: Moderate degenerative changes. Evidence of chronic/ calcific rotator
cuff tendinopathy. No acute findings.

## 2018-01-06 DIAGNOSIS — H9042 Sensorineural hearing loss, unilateral, left ear, with unrestricted hearing on the contralateral side: Secondary | ICD-10-CM | POA: Diagnosis not present

## 2018-01-06 DIAGNOSIS — H9319 Tinnitus, unspecified ear: Secondary | ICD-10-CM | POA: Diagnosis not present

## 2018-01-06 DIAGNOSIS — H9312 Tinnitus, left ear: Secondary | ICD-10-CM | POA: Diagnosis not present

## 2018-02-07 ENCOUNTER — Ambulatory Visit: Payer: BLUE CROSS/BLUE SHIELD | Admitting: Family Medicine

## 2018-02-07 ENCOUNTER — Encounter: Payer: Self-pay | Admitting: Family Medicine

## 2018-02-07 VITALS — BP 128/82 | HR 88 | Temp 98.3°F | Resp 16 | Ht 69.0 in | Wt 309.1 lb

## 2018-02-07 DIAGNOSIS — Z13220 Encounter for screening for lipoid disorders: Secondary | ICD-10-CM

## 2018-02-07 DIAGNOSIS — E559 Vitamin D deficiency, unspecified: Secondary | ICD-10-CM

## 2018-02-07 DIAGNOSIS — G4730 Sleep apnea, unspecified: Secondary | ICD-10-CM | POA: Diagnosis not present

## 2018-02-07 DIAGNOSIS — Z114 Encounter for screening for human immunodeficiency virus [HIV]: Secondary | ICD-10-CM

## 2018-02-07 DIAGNOSIS — Z1159 Encounter for screening for other viral diseases: Secondary | ICD-10-CM

## 2018-02-07 DIAGNOSIS — Z125 Encounter for screening for malignant neoplasm of prostate: Secondary | ICD-10-CM | POA: Diagnosis not present

## 2018-02-07 DIAGNOSIS — R42 Dizziness and giddiness: Secondary | ICD-10-CM | POA: Diagnosis not present

## 2018-02-07 DIAGNOSIS — H9319 Tinnitus, unspecified ear: Secondary | ICD-10-CM | POA: Diagnosis not present

## 2018-02-07 DIAGNOSIS — Z23 Encounter for immunization: Secondary | ICD-10-CM

## 2018-02-07 DIAGNOSIS — Z131 Encounter for screening for diabetes mellitus: Secondary | ICD-10-CM

## 2018-02-07 DIAGNOSIS — G4733 Obstructive sleep apnea (adult) (pediatric): Secondary | ICD-10-CM | POA: Insufficient documentation

## 2018-02-07 DIAGNOSIS — Z6841 Body Mass Index (BMI) 40.0 and over, adult: Secondary | ICD-10-CM

## 2018-02-07 NOTE — Progress Notes (Signed)
Name: John York   MRN: [id number]    DOB: 1960/11/05   Date:02/07/2018       Progress Note  Subjective  Chief Complaint  Chief Complaint  Patient presents with  . Establish Care  . Sleep Study    have [place] license and recently had physical and was told to follow up with PCP regarding sleep study    HPI  PT presents to establish care - he has been seeing [employer] [facility] at [facility], but is no longer able to do so.  OSA: Was diagnosed many years ago; was never given a CPAP machine - he's not sure if he was supposed to have a machine or not.  Denies daytime sleepiness; does snore loudly per family member's reports. He is also obese.  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  Obesity: Body mass index is 45.65 kg/m.  He has had some LEFT knee pain ongoing - used to do cardio in the mornings, but hasn't been lately.  He eats a balanced diet.  We will check labs today.  No hx DM or HLD.  Hx Colonic Polyps and family history of colorectal cancer (Brother - passed away in his 47's from this). Screening is UTD.  Tinnitus: Has been ongoing; seeing [facility] and has been having hearing testing and no medication at this time. No dizziness with the tinnitus, but does have history of vertigo with no recent attacks.  Health Maintenance - we will check labs today and schedule CPE.  TDAP today.  Patient Active Problem List   Diagnosis Date Noted  . Tinnitus 06/16/2017  . Annual physical exam 09/25/2014  . Obesity 09/25/2014  . FHx: migraine headaches 09/25/2014  . Clicking shoulder 71/24/5809  . Rotator cuff syndrome 04/16/2014  . Other specified joint disorders, unspecified shoulder 04/16/2014  . 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 Shoulder strain 03/21/2014  . Family history of malignant neoplasm of gastrointestinal tract   . Personal history of colonic polyps     Past  Surgical History:  Procedure Laterality Date  . COLONOSCOPY  02/03/2012   Dr. Jamal Collin  . COLONOSCOPY W/ POLYPECTOMY  2008   69mm size sessile non bleeding polyp was found in the sigmoid colon  . COLONOSCOPY WITH PROPOFOL N/A 02/24/2017   Procedure: COLONOSCOPY WITH PROPOFOL;  Surgeon: Christene Lye, MD;  Location: [facility] ENDOSCOPY;  Service: Endoscopy;  Laterality: N/A;    Family History  Problem Relation Age of Onset  . Cancer Sister        breast  . Colon cancer Brother     Social History   Socioeconomic History  . Marital status: Single    Spouse name: Not on file  . Number of children: 2  . Years of education: Not on file  . Highest education level: Not on file  Occupational History    Comment: Director of [employer]  . Financial resource strain: Not hard at all  . Food insecurity:    Worry: Never true    Inability: Never true  . Transportation needs:    Medical: No    Non-medical: No  Tobacco Use  . Smoking status: Never Smoker  . Smokeless tobacco: Never Used  Substance and Sexual Activity  . Alcohol use: No    Alcohol/week: 0.0 standard drinks  . Drug use: No  . Sexual activity: Not Currently  Lifestyle  .  Physical activity:    Days per week: 2 days    Minutes per session: 30 min  . Stress: Only a little  Relationships  . Social connections:    Talks on phone: More than three times a week    Gets together: More than three times a week    Attends religious service: More than 4 times per year    Active member of club or organization: Yes    Attends meetings of clubs or organizations: More than 4 times per year    Relationship status: Separated  . Intimate partner violence:    Fear of current or ex partner: No    Emotionally abused: No    Physically abused: No    Forced sexual activity: No  Other Topics Concern  . Not on file  Social History Narrative  . Not on file     Current Outpatient Medications:  .  cetirizine  (ZYRTEC) 10 MG tablet, Take 1 tablet (10 mg total) by mouth daily., Disp: 30 tablet, Rfl: 11 .  cholecalciferol (VITAMIN D) 1000 units tablet, Take 1,000 Units daily by mouth., Disp: , Rfl:  .  azithromycin (ZITHROMAX) 250 MG tablet, By mouth Take 2 tablets day 1 (500mg  total) and 1 tablet ( 250 mg ) days 2,3,4,5. (Patient not taking: Reported on 02/07/2018), Disp: 6 tablet, Rfl: 0 .  fluticasone (FLONASE) 50 MCG/ACT nasal spray, Place 2 sprays into both nostrils daily. (Patient not taking: Reported on 02/07/2018), Disp: 16 g, Rfl: 1  No Known Allergies  I personally reviewed active problem list, medication list, allergies, family history, social history, health maintenance, notes from last encounter, lab results with the patient/caregiver today.   ROS  Constitutional: Negative for fever or weight change.  Respiratory: Negative for cough and shortness of breath.   Cardiovascular: Negative for chest pain or palpitations.  Gastrointestinal: Negative for abdominal pain, no bowel changes.  Musculoskeletal: Negative for gait problem or joint swelling.  Skin: Negative for rash.  Neurological: Negative for dizziness or headache.  No other specific complaints in a complete review of systems (except as listed in HPI above).  Objective  Vitals:   02/07/18 1018  BP: 128/82  Pulse: 88  Resp: 16  Temp: 98.3 F (36.8 C)  TempSrc: Oral  SpO2: 99%  Weight: (!) 309 lb 1.6 oz (140.2 kg)  Height: 5\' 9"  (1.753 m)   Body mass index is 45.65 kg/m.  Physical Exam  Constitutional: Patient appears well-developed and well-nourished, obese. No distress.  HENT: Head: Normocephalic and atraumatic. Ears: bilateral TMs with no erythema or effusion; Nose: Nose normal. Mouth/Throat: Oropharynx is clear and moist. No oropharyngeal exudate or tonsillar swelling.  Eyes: Conjunctivae and EOM are normal. No scleral icterus.  Pupils are equal, round, and reactive to light.  Neck: Normal range of motion. Neck  supple. No JVD present. No thyromegaly present.  Cardiovascular: Normal rate, regular rhythm and normal heart sounds.  No murmur heard. No BLE edema. Pulmonary/Chest: Effort normal and breath sounds normal. No respiratory distress. Musculoskeletal: Normal range of motion, no joint effusions. No gross deformities Neurological: Pt is alert and oriented to person, place, and time. No cranial nerve deficit. Coordination, balance, strength, speech and gait are normal.  Skin: Skin is warm and dry. No rash noted. No erythema.  Psychiatric: Patient has a normal mood and affect. behavior is normal. Judgment and thought content normal.  PHQ2/9: Depression screen [facility] 2/9 02/07/2018 04/01/2015  Decreased Interest 0 0  Down, Depressed, Hopeless 0  0  PHQ - 2 Score 0 0  Altered sleeping 0 -  Tired, decreased energy 0 -  Change in appetite 0 -  Feeling bad or failure about yourself  0 -  Trouble concentrating 0 -  Suicidal thoughts 0 -  PHQ-9 Score 0 -  Difficult doing work/chores Not difficult at all -    Fall Risk: Fall Risk  02/07/2018 04/01/2015 09/25/2014  Falls in the past year? 0 No No  Number falls in past yr: 0 - -  Injury with Fall? 0 - -   Assessment & Plan  1. Sleep apnea, unspecified type - Ambulatory referral to Pulmonology - CBC w/Diff/Platelet  2. Tinnitus, unspecified laterality - Keep follow up with ENT.   3. Vertigo - Keep follow up with ENT.   4. Diabetes mellitus screening - Hemoglobin A1c - COMPLETE METABOLIC PANEL WITH GFR  5. Need for hepatitis C screening test - Hepatitis C antibody  6. Prostate cancer screening - PSA  7. Class 3 severe obesity due to excess calories with serious comorbidity and body mass index (BMI) of 45.0 to 49.9 in adult [facility]) - Discussed importance of 150 minutes of physical activity weekly, eat two servings of fish weekly, eat one serving of tree nuts ( cashews, pistachios, pecans, almonds.Marland Kitchen) every other day, eat 6 servings of  fruit/vegetables daily and drink plenty of water and avoid sweet beverages.  - Hemoglobin A1c - COMPLETE METABOLIC PANEL WITH GFR - TSH  8. Lipid screening - Lipid panel  9. Need for Tdap vaccination - Tdap vaccine greater than or equal to 7yo IM  10. Screening for HIV without presence of risk factors - HIV Antibody (routine testing w rflx)  11. Vitamin D deficiency - Vitamin D (25 hydroxy)

## 2018-02-08 ENCOUNTER — Other Ambulatory Visit: Payer: Self-pay | Admitting: Family Medicine

## 2018-02-08 DIAGNOSIS — E119 Type 2 diabetes mellitus without complications: Secondary | ICD-10-CM | POA: Insufficient documentation

## 2018-02-08 DIAGNOSIS — E785 Hyperlipidemia, unspecified: Secondary | ICD-10-CM

## 2018-02-08 DIAGNOSIS — E1169 Type 2 diabetes mellitus with other specified complication: Secondary | ICD-10-CM | POA: Insufficient documentation

## 2018-02-08 LAB — HEMOGLOBIN A1C
Hgb A1c MFr Bld: 6.7 % of total Hgb — ABNORMAL HIGH (ref <5.7)
Mean Plasma Glucose: 146 (calc)
eAG (mmol/L): 8.1 (calc)

## 2018-02-08 LAB — CBC WITH DIFFERENTIAL/PLATELET
Basophils Absolute: 39 cells/uL (ref 0–200)
Basophils Relative: 0.7 %
Eosinophils Absolute: 182 cells/uL (ref 15–500)
Eosinophils Relative: 3.3 %
HCT: 43.2 % (ref 38.5–50.0)
Hemoglobin: 14.4 g/dL (ref 13.2–17.1)
Lymphs Abs: 1881 cells/uL (ref 850–3900)
MCH: 28.6 pg (ref 27.0–33.0)
MCHC: 33.3 g/dL (ref 32.0–36.0)
MCV: 85.7 fL (ref 80.0–100.0)
MPV: 10.4 fL (ref 7.5–12.5)
Monocytes Relative: 8.7 %
Neutro Abs: 2921 cells/uL (ref 1500–7800)
Neutrophils Relative %: 53.1 %
Platelets: 328 10*3/uL (ref 140–400)
RBC: 5.04 10*6/uL (ref 4.20–5.80)
RDW: 13.9 % (ref 11.0–15.0)
Total Lymphocyte: 34.2 %
WBC mixed population: 479 cells/uL (ref 200–950)
WBC: 5.5 10*3/uL (ref 3.8–10.8)

## 2018-02-08 LAB — LIPID PANEL
Cholesterol: 142 mg/dL (ref <200)
HDL: 38 mg/dL — ABNORMAL LOW (ref >40)
LDL Cholesterol (Calc): 84 mg/dL (calc)
Non-HDL Cholesterol (Calc): 104 mg/dL (calc) (ref <130)
Total CHOL/HDL Ratio: 3.7 (calc) (ref <5.0)
Triglycerides: 104 mg/dL (ref <150)

## 2018-02-08 LAB — COMPLETE METABOLIC PANEL WITH GFR
AG Ratio: 1.4 (calc) (ref 1.0–2.5)
ALT: 23 U/L (ref 9–46)
AST: 24 U/L (ref 10–35)
Albumin: 4.1 g/dL (ref 3.6–5.1)
Alkaline phosphatase (APISO): 87 U/L (ref 40–115)
BUN: 14 mg/dL (ref 7–25)
CO2: 27 mmol/L (ref 20–32)
Calcium: 9.3 mg/dL (ref 8.6–10.3)
Chloride: 105 mmol/L (ref 98–110)
Creat: 1.11 mg/dL (ref 0.70–1.33)
GFR, Est African American: 84 mL/min/{1.73_m2} (ref >=60)
GFR, Est Non African American: 73 mL/min/{1.73_m2} (ref >=60)
Globulin: 2.9 g/dL (calc) (ref 1.9–3.7)
Glucose, Bld: 87 mg/dL (ref 65–139)
Potassium: 4.4 mmol/L (ref 3.5–5.3)
Sodium: 139 mmol/L (ref 135–146)
Total Bilirubin: 0.5 mg/dL (ref 0.2–1.2)
Total Protein: 7 g/dL (ref 6.1–8.1)

## 2018-02-08 LAB — VITAMIN D 25 HYDROXY (VIT D DEFICIENCY, FRACTURES): Vit D, 25-Hydroxy: 31 ng/mL (ref 30–100)

## 2018-02-08 LAB — TSH: TSH: 0.91 mIU/L (ref 0.40–4.50)

## 2018-02-08 LAB — HEPATITIS C ANTIBODY
Hepatitis C Ab: NONREACTIVE
SIGNAL TO CUT-OFF: 0.02 (ref <1.00)

## 2018-02-08 LAB — PSA: PSA: 0.6 ng/mL (ref <=4.0)

## 2018-02-08 LAB — HIV ANTIBODY (ROUTINE TESTING W REFLEX): HIV: NONREACTIVE

## 2018-02-08 MED ORDER — ROSUVASTATIN CALCIUM 10 MG PO TABS: 10.0000 mg | ORAL_TABLET | Freq: Every day | ORAL | 1 refills | Status: DC

## 2018-02-09 ENCOUNTER — Encounter: Payer: Self-pay | Admitting: Internal Medicine

## 2018-02-09 ENCOUNTER — Ambulatory Visit (INDEPENDENT_AMBULATORY_CARE_PROVIDER_SITE_OTHER): Payer: BLUE CROSS/BLUE SHIELD | Admitting: Internal Medicine

## 2018-02-09 VITALS — BP 134/80 | HR 86 | Ht 69.0 in | Wt 310.0 lb

## 2018-02-09 DIAGNOSIS — G4719 Other hypersomnia: Secondary | ICD-10-CM

## 2018-02-09 NOTE — Patient Instructions (Signed)
Obtain Sleep study   Recommend weight loss

## 2018-02-09 NOTE — Progress Notes (Signed)
Name: John York MRN: [id number] DOB: [birth year]     CONSULTATION DATE: 11.27.19 REFERRING MD : boyce  CHIEF COMPLAINT: excessive daytime sleepiness  STUDIES:  No studies seen in chart  HISTORY OF PRESENT ILLNESS: 57 yo pleasant AAM seen today for assessment for OSA  Patient has been having excessive daytime sleepiness Patient has been having extreme fatigue and tiredness, lack of energy +  very Loud snoring every night Does NOT recall struggling breathe at night and gasps for air  EPWORTH SLEEP SCORE 7 Neck size >17 inches  Weighs 310 pounds currently College weight 225 pounds  Discussed sleep data and reviewed with patient.  Encouraged proper weight management.  Discussed driving precautions and its relationship with hypersomnolence.  Discussed operating dangerous equipment and its relationship with hypersomnolence.  Discussed sleep hygiene, and benefits of a fixed sleep waked time.  The importance of getting eight or more hours of sleep discussed with patient.  Discussed limiting the use of the computer and television before bedtime.  Decrease naps during the day, so night time sleep will become enhanced.  Limit caffeine, and sleep deprivation.  HTN, stroke, and heart failure are potential risk factors.   No signs of infection No signs of CHF   PAST MEDICAL HISTORY :   has a past medical history of Family history of malignant neoplasm of gastrointestinal tract, Personal history of colonic polyps (2013), Screening for obesity, and Special screening for malignant neoplasms, colon (2013).  has a past surgical history that includes Colonoscopy w/ polypectomy (2008); Colonoscopy (02/03/2012); and Colonoscopy with propofol (N/A, 02/24/2017). Prior to Admission medications   Medication Sig Start Date End Date Taking? Authorizing Provider  cetirizine (ZYRTEC) 10 MG tablet Take 1 tablet (10 mg total) by mouth daily. 06/16/17   Flinchum, Kelby Aline, FNP    cholecalciferol (VITAMIN D) 1000 units tablet Take 1,000 Units daily by mouth.    [provider]  rosuvastatin (CRESTOR) 10 MG tablet Take 1 tablet (10 mg total) by mouth daily. 02/08/18   Hubbard Hartshorn, FNP   No Known Allergies  FAMILY HISTORY:  family history includes Cancer in his sister; Colon cancer in his brother. SOCIAL HISTORY:  reports that he has never smoked. He has never used smokeless tobacco. He reports that he does not drink alcohol or use drugs.  REVIEW OF SYSTEMS:   Constitutional: Negative for fever, chills, weight loss, malaise/fatigue and diaphoresis.  HENT: Negative for hearing loss, ear pain, nosebleeds, congestion, sore throat, neck pain, tinnitus and ear discharge.   Eyes: Negative for blurred vision, double vision, photophobia, pain, discharge and redness.  Respiratory: Negative for cough, hemoptysis, sputum production, shortness of breath, wheezing and stridor.   Cardiovascular: Negative for chest pain, palpitations, orthopnea, claudication, leg swelling and PND.  Gastrointestinal: Negative for heartburn, nausea, vomiting, abdominal pain, diarrhea, constipation, blood in stool and melena.  Genitourinary: Negative for dysuria, urgency, frequency, hematuria and flank pain.  Musculoskeletal: Negative for myalgias, back pain, joint pain and falls.  Skin: Negative for itching and rash.  Neurological: Negative for dizziness, tingling, tremors, sensory change, speech change, focal weakness, seizures, loss of consciousness, weakness and headaches.  Endo/Heme/Allergies: Negative for environmental allergies and polydipsia. Does not bruise/bleed easily.  ALL OTHER ROS ARE NEGATIVE   BP 134/80 (BP Location: Left Arm, Cuff Size: Normal)   Pulse 86   Ht 5\' 9"  (1.753 m)   Wt (!) 310 lb (140.6 kg)   SpO2 97%   BMI 45.78 kg/m  Physical Examination:   GENERAL:NAD, no fevers, chills, no weakness no fatigue HEAD: Normocephalic, atraumatic.  EYES: Pupils  equal, round, reactive to light. Extraocular muscles intact. No scleral icterus.  MOUTH: Moist mucosal membrane.   EAR, NOSE, THROAT: Clear without exudates. No external lesions.  NECK: Supple. No thyromegaly. No nodules. No JVD.  PULMONARY:CTA B/L no wheezes, no crackles, no rhonchi CARDIOVASCULAR: S1 and S2. Regular rate and rhythm. No murmurs, rubs, or gallops. No edema.  GASTROINTESTINAL: Soft, nontender, nondistended. No masses. Positive bowel sounds.  MUSCULOSKELETAL: No swelling, clubbing, or edema. Range of motion full in all extremities.  NEUROLOGIC: Cranial nerves II through XII are intact. No gross focal neurological deficits.  SKIN: No ulceration, lesions, rashes, or cyanosis. Skin warm and dry. Turgor intact.  PSYCHIATRIC: Mood, affect within normal limits. The patient is awake, alert and oriented x 3. Insight, judgment intact.      ASSESSMENT / PLAN: 57 yo morbidly obese AAM with signs and symptoms of excessive daytime sleepiness and snoring highly suggestive of underlying Obstructive sleep apnea  Patient needs Sleep Study ASAP  Obesity -recommend significant weight loss -recommend changing diet  Deconditioned state -Recommend increased daily activity and exercise     Patient  satisfied with Plan of action and management. All questions answered Follow up after test completed   Corrin Parker, M.D.  [facility]  Medical Director [place] Director [facility] Cardio-Pulmonary Department

## 2018-02-17 DIAGNOSIS — G4733 Obstructive sleep apnea (adult) (pediatric): Secondary | ICD-10-CM | POA: Diagnosis not present

## 2018-02-18 ENCOUNTER — Telehealth: Payer: Self-pay | Admitting: *Deleted

## 2018-02-18 DIAGNOSIS — G4733 Obstructive sleep apnea (adult) (pediatric): Secondary | ICD-10-CM | POA: Diagnosis not present

## 2018-02-18 DIAGNOSIS — G4719 Other hypersomnia: Secondary | ICD-10-CM

## 2018-02-18 NOTE — Telephone Encounter (Signed)
LMTCB  Recommendation is for auto-cpap with pressure range of 5-20 cmH20  Severe OSA AHI 30 Total apneas 141 20 per hour.

## 2018-02-21 NOTE — Telephone Encounter (Signed)
Patient returning call results 

## 2018-02-21 NOTE — Telephone Encounter (Signed)
Pt aware Orders placed Nothing further needed. 

## 2018-02-21 NOTE — Telephone Encounter (Signed)
[email or web address] 

## 2018-02-28 ENCOUNTER — Encounter: Payer: BLUE CROSS/BLUE SHIELD | Attending: Family Medicine | Admitting: *Deleted

## 2018-02-28 ENCOUNTER — Encounter: Payer: Self-pay | Admitting: *Deleted

## 2018-02-28 VITALS — BP 150/90 | Ht 69.0 in | Wt 308.9 lb

## 2018-02-28 DIAGNOSIS — E1169 Type 2 diabetes mellitus with other specified complication: Secondary | ICD-10-CM | POA: Insufficient documentation

## 2018-02-28 DIAGNOSIS — E785 Hyperlipidemia, unspecified: Secondary | ICD-10-CM | POA: Insufficient documentation

## 2018-02-28 DIAGNOSIS — E119 Type 2 diabetes mellitus without complications: Secondary | ICD-10-CM

## 2018-02-28 NOTE — Patient Instructions (Signed)
Check blood sugars 2 x day before breakfast and 2 hrs after supper 3 x week Bring blood sugar records to the next class  Call your doctor for a prescription for:  1. Meter strips (type) Contour Next  checking 3 times per week  2. Lancets (type) Contour Microlet checking 3 times per week  Exercise: Continue cardio for 30  minutes 3 days a week and gradually increase to 30 minutes 5 x week  Eat 3 meals day,  1-2 snacks a day Space meals 4-6 hours apart Limit fried foods  Return for classes on:

## 2018-03-01 NOTE — Progress Notes (Signed)
Diabetes Self-Management Education  Visit Type: First/Initial  Appt. Start Time: 1550 Appt. End Time: 1710  02/28/2018  Mr. John York, identified by name and date of birth, is a 57 y.o. male with a diagnosis of Diabetes: Type 2.   ASSESSMENT  Blood pressure (!) 150/90, height 5\' 9"  (1.753 m), weight (!) 308 lb 14.4 oz (140.1 kg). Body mass index is 45.62 kg/m.  Diabetes Self-Management Education - 02/28/18 1742      Visit Information   Visit Type  First/Initial      Initial Visit   Diabetes Type  Type 2    Are you currently following a meal plan?  No   "trying to cut carbs, especially sweets"   Are you taking your medications as prescribed?  Yes    Date Diagnosed  3 weeks      Health Coping   How would you rate your overall health?  Fair      Psychosocial Assessment   Patient Belief/Attitude about Diabetes  Motivated to manage diabetes   "worried"   Self-care barriers  None    Self-management support  Doctor's office;Family    Patient Concerns  Nutrition/Meal planning;Monitoring;Glycemic Control;Weight Control;Healthy Lifestyle;Other (comment)   enjoy life, especially grandchildren   Special Needs  None    Preferred Learning Style  Auditory;Visual;Hands on    Learning Readiness  Ready    How often do you need to have someone help you when you read instructions, pamphlets, or other written materials from your doctor or pharmacy?  1 - Never    What is the last grade level you completed in school?  Masters      Pre-Education Assessment   Patient understands the diabetes disease and treatment process.  Needs Instruction    Patient understands incorporating nutritional management into lifestyle.  Needs Instruction    Patient undertands incorporating physical activity into lifestyle.  Needs Instruction    Patient understands using medications safely.  Needs Instruction    Patient understands monitoring blood glucose, interpreting and using results  Needs Instruction     Patient understands prevention, detection, and treatment of acute complications.  Needs Instruction    Patient understands prevention, detection, and treatment of chronic complications.  Needs Instruction    Patient understands how to develop strategies to address psychosocial issues.  Needs Instruction    Patient understands how to develop strategies to promote health/change behavior.  Needs Instruction      Complications   Last HgB A1C per patient/outside source  6.7 %   02/07/18   How often do you check your blood sugar?  0 times/day (not testing)   Provided Contour Next One meter and instructed on use.  BG upon return demonstration was 89 mg/dL at 4:55 pm - 4 hrs pp.    Have you had a dilated eye exam in the past 12 months?  Yes    Have you had a dental exam in the past 12 months?  Yes    Are you checking your feet?  No      Dietary Intake   Breakfast  oatmeal, muffin, bacon egg and cheese    Lunch  beef, pork, chicken, fish, salads, veggies    Snack (afternoon)  chips    Dinner  2 meats and occasional potatoes, rice and pasta; brussel sprouts, green beans, salads with lettuce, tomatoes, onions, bacon bits and cheese    Beverage(s)  water, coffee, diet soda      Exercise   Exercise Type  Moderate (swimming / aerobic walking)    How many days per week to you exercise?  2    How many minutes per day do you exercise?  30    Total minutes per week of exercise  60      Patient Education   Previous Diabetes Education  No    Disease state   Definition of diabetes, type 1 and 2, and the diagnosis of diabetes;Factors that contribute to the development of diabetes    Nutrition management   Role of diet in the treatment of diabetes and the relationship between the three main macronutrients and blood glucose level;Food label reading, portion sizes and measuring food.;Reviewed blood glucose goals for pre and post meals and how to evaluate the patients' food intake on their blood glucose  level.    Physical activity and exercise   Role of exercise on diabetes management, blood pressure control and cardiac health.    Monitoring  Taught/evaluated SMBG meter.;Purpose and frequency of SMBG.;Taught/discussed recording of test results and interpretation of SMBG.;Identified appropriate SMBG and/or A1C goals.    Chronic complications  Relationship between chronic complications and blood glucose control    Psychosocial adjustment  Identified and addressed patients feelings and concerns about diabetes      Individualized Goals (developed by patient)   Reducing Risk  Improve blood sugars Prevent diabetes complications Lose weight Lead a healthier lifestyle Become more fit Enjoy life, especially grandchildren     Outcomes   Expected Outcomes  Demonstrated interest in learning. Expect positive outcomes       Individualized Plan for Diabetes Self-Management Training:   Learning Objective:  Patient will have a greater understanding of diabetes self-management. Patient education plan is to attend individual and/or group sessions per assessed needs and concerns.   Plan:   Patient Instructions  Check blood sugars 2 x day before breakfast and 2 hrs after supper 3 x week Bring blood sugar records to the next class Call your doctor for a prescription for:  1. Meter strips (type) Contour Next  checking 3 times per week  2. Lancets (type) Contour Microlet checking 3 times per week Exercise: Continue cardio for 30  minutes 3 days a week and gradually increase to 30 minutes 5 x week Eat 3 meals day,  1-2 snacks a day Space meals 4-6 hours apart Limit fried foods  Expected Outcomes:  Demonstrated interest in learning. Expect positive outcomes  Education material provided:  General Meal Planning Guidelines Simple Meal Plan Meter = Contour Next One  If problems or questions, patient to contact team via:  Johny Drilling, [place], [place], CDE [phone]  Future DSME appointment: March 21, 2017 for Diabetes Class 1

## 2018-03-04 DIAGNOSIS — G4733 Obstructive sleep apnea (adult) (pediatric): Secondary | ICD-10-CM | POA: Diagnosis not present

## 2018-03-21 ENCOUNTER — Encounter: Payer: BLUE CROSS/BLUE SHIELD | Attending: Family Medicine | Admitting: Dietician

## 2018-03-21 ENCOUNTER — Encounter: Payer: Self-pay | Admitting: Dietician

## 2018-03-21 VITALS — Ht 69.0 in | Wt 306.9 lb

## 2018-03-21 DIAGNOSIS — E1169 Type 2 diabetes mellitus with other specified complication: Secondary | ICD-10-CM | POA: Insufficient documentation

## 2018-03-21 DIAGNOSIS — E785 Hyperlipidemia, unspecified: Secondary | ICD-10-CM | POA: Diagnosis not present

## 2018-03-21 DIAGNOSIS — E119 Type 2 diabetes mellitus without complications: Secondary | ICD-10-CM

## 2018-03-21 NOTE — Progress Notes (Signed)

## 2018-03-24 ENCOUNTER — Encounter: Payer: BLUE CROSS/BLUE SHIELD | Admitting: Family Medicine

## 2018-03-28 ENCOUNTER — Encounter: Payer: BLUE CROSS/BLUE SHIELD | Admitting: Dietician

## 2018-03-28 ENCOUNTER — Encounter: Payer: Self-pay | Admitting: Dietician

## 2018-03-28 VITALS — Wt 303.7 lb

## 2018-03-28 DIAGNOSIS — E785 Hyperlipidemia, unspecified: Secondary | ICD-10-CM | POA: Diagnosis not present

## 2018-03-28 DIAGNOSIS — E1169 Type 2 diabetes mellitus with other specified complication: Secondary | ICD-10-CM | POA: Diagnosis not present

## 2018-03-28 DIAGNOSIS — E119 Type 2 diabetes mellitus without complications: Secondary | ICD-10-CM

## 2018-03-28 NOTE — Progress Notes (Signed)

## 2018-03-29 NOTE — Addendum Note (Signed)
Addended by: Maryanna Shape A on: 03/29/2018 02:13 PM   Modules accepted: Orders

## 2018-04-04 ENCOUNTER — Encounter: Payer: Self-pay | Admitting: Dietician

## 2018-04-04 ENCOUNTER — Encounter: Payer: BLUE CROSS/BLUE SHIELD | Admitting: Dietician

## 2018-04-04 VITALS — BP 140/78 | Ht 69.0 in | Wt 307.5 lb

## 2018-04-04 DIAGNOSIS — G4733 Obstructive sleep apnea (adult) (pediatric): Secondary | ICD-10-CM | POA: Diagnosis not present

## 2018-04-04 DIAGNOSIS — E119 Type 2 diabetes mellitus without complications: Secondary | ICD-10-CM

## 2018-04-04 DIAGNOSIS — E1169 Type 2 diabetes mellitus with other specified complication: Secondary | ICD-10-CM | POA: Diagnosis not present

## 2018-04-04 DIAGNOSIS — E785 Hyperlipidemia, unspecified: Secondary | ICD-10-CM | POA: Diagnosis not present

## 2018-04-04 NOTE — Progress Notes (Signed)

## 2018-04-05 ENCOUNTER — Encounter: Payer: Self-pay | Admitting: *Deleted

## 2018-04-07 ENCOUNTER — Encounter: Payer: Self-pay | Admitting: Family Medicine

## 2018-04-07 ENCOUNTER — Ambulatory Visit (INDEPENDENT_AMBULATORY_CARE_PROVIDER_SITE_OTHER): Payer: BLUE CROSS/BLUE SHIELD | Admitting: Family Medicine

## 2018-04-07 VITALS — BP 130/80 | HR 80 | Temp 98.5°F | Resp 16 | Ht 69.0 in | Wt 304.2 lb

## 2018-04-07 DIAGNOSIS — Z0000 Encounter for general adult medical examination without abnormal findings: Secondary | ICD-10-CM | POA: Diagnosis not present

## 2018-04-07 NOTE — Progress Notes (Signed)
Name: John York   MRN: [id number]    DOB: 1960-05-21   Date:04/07/2018       Progress Note  Subjective  Chief Complaint  Chief Complaint  Patient presents with  . Annual Exam    HPI  Patient presents for annual CPE.  USPSTF grade A and B recommendations:  Diet: Went to Diabetes education classes.  Exercise: Exercising 4 days a week  Depression:  Depression screen [facility] 2/9 04/07/2018 02/28/2018 02/07/2018 04/01/2015  Decreased Interest 0 0 0 0  Down, Depressed, Hopeless 0 0 0 0  PHQ - 2 Score 0 0 0 0  Altered sleeping 0 - 0 -  Tired, decreased energy 0 - 0 -  Change in appetite 0 - 0 -  Feeling bad or failure about yourself  0 - 0 -  Trouble concentrating 0 - 0 -  Moving slowly or fidgety/restless 0 - - -  Suicidal thoughts 0 - 0 -  PHQ-9 Score 0 - 0 -  Difficult doing work/chores Not difficult at all - Not difficult at all -    Hypertension:  BP Readings from Last 3 Encounters:  04/07/18 130/80  04/04/18 140/78  02/28/18 (!) 150/90    Obesity: Obtained CPAP for OSA.  Wt Readings from Last 3 Encounters:  04/07/18 (!) 304 lb 3.2 oz (138 kg)  04/04/18 (!) 307 lb 8 oz (139.5 kg)  03/28/18 (!) 303 lb 11.2 oz (137.8 kg)   BMI Readings from Last 3 Encounters:  04/07/18 44.92 kg/m  04/04/18 45.41 kg/m  03/28/18 44.85 kg/m    Lipids: Seeing nutrition right now. Lab Results  Component Value Date   CHOL 142 02/07/2018   Lab Results  Component Value Date   HDL 38 (L) 02/07/2018   Lab Results  Component Value Date   LDLCALC 84 02/07/2018   Lab Results  Component Value Date   TRIG 104 02/07/2018   Lab Results  Component Value Date   CHOLHDL 3.7 02/07/2018   No results found for: LDLDIRECT Glucose: Newly Diabetic - seeing nutrition. Glucose  Date Value Ref Range Status  08/05/2016 94 65 - 99 mg/dL Final   Glucose, Bld  Date Value Ref Range Status  02/07/2018 87 65 - 139 mg/dL Final    Comment:    .        Non-fasting reference  interval .       [facility] Visit from 04/07/2018 in [facility]  AUDIT-C Score  0     Single STD testing and prevention (HIV/chl/gon/syphilis): Declines; negative HIV in November 2019 Hep C: Negative in November 2019  Skin cancer: No concerning lesions Colorectal cancer: Brother with hx, pt has had benign polyps, follows up every 5 years.  No changes in BM's - no blood in stool, dark and tarry stool, mucus in stool, or constipation/diarrhea. Prostate cancer: No prostate cancer history in family or self.  Lab Results  Component Value Date   PSA 0.6 02/07/2018   IPSS Questionnaire (AUA-7): Over the past month.   1)  How often have you had a sensation of not emptying your bladder completely after you finish urinating?  0 - Not at all  2)  How often have you had to urinate again less than two hours after you finished urinating? 1 - Less than 1 time in 5  3)  How often have you found you stopped and started again several times when you urinated?  1 - Less than 1 time in 5  4) How difficult have you found it to postpone urination?  1 - Less than 1 time in 5  5) How often have you had a weak urinary stream?  0 - Not at all  6) How often have you had to push or strain to begin urination?  0 - Not at all  7) How many times did you most typically get up to urinate from the time you went to bed until the time you got up in the morning?  1 - 1 time  Total score:  0-7 mildly symptomatic   8-19 moderately symptomatic   20-35 severely symptomatic  Score of 4, recent PSA normal.  Lung cancer:  Never smoker. Low Dose CT Chest recommended if Age 4-80 years, 30 pack-year currently smoking OR have quit w/in 15years. Patient does not qualify.   AAA: N/A; The USPSTF recommends one-time screening with ultrasonography in men ages 62 to 77 years who have ever smoked ECG:  On file from 2009  [place]: A voluntary discussion about advance care planning including the  explanation and discussion of advance directives.  Discussed health care proxy and Living will, and the patient was able to identify a health care proxy as Daughter Administrator, sports) .  Patient does not have a living will at present time. If patient does have living will, I have requested they bring this to the clinic to be scanned in to their chart.  Patient Active Problem List   Diagnosis Date Noted  . Diet-controlled diabetes mellitus ([place]) 02/08/2018  . Hyperlipidemia associated with type 2 diabetes mellitus ([place]) 02/08/2018  . Vertigo 02/07/2018  . OSA (obstructive sleep apnea) 02/07/2018  . Tinnitus 06/16/2017  . Annual physical exam 09/25/2014  . Obesity 09/25/2014  . FHx: migraine headaches 09/25/2014  . Other specified joint disorders, unspecified shoulder 04/16/2014  . 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 Family history of malignant neoplasm of gastrointestinal tract   . Personal history of colonic polyps     Past Surgical History:  Procedure Laterality Date  . COLONOSCOPY  02/03/2012   Dr. Jamal Collin  . COLONOSCOPY W/ POLYPECTOMY  2008   84mm size sessile non bleeding polyp was found in the sigmoid colon  . COLONOSCOPY WITH PROPOFOL N/A 02/24/2017   Procedure: COLONOSCOPY WITH PROPOFOL;  Surgeon: Christene Lye, MD;  Location: [facility] ENDOSCOPY;  Service: Endoscopy;  Laterality: N/A;    Family History  Problem Relation Age of Onset  . Cancer Sister        breast  . Colon cancer Brother   . Diabetes Father     Social History   Socioeconomic History  . Marital status: Single    Spouse name: Not on file  . Number of children: 2  . Years of education: Not on file  . Highest education level: Not on file  Occupational History    Comment: Director of [employer]  . Financial resource strain: Not hard at all  . Food insecurity:    Worry: Never true    Inability: Never true  . Transportation needs:    Medical: No    Non-medical: No  Tobacco Use   . Smoking status: Never Smoker  . Smokeless tobacco: Never Used  Substance and Sexual Activity  . Alcohol use: No    Alcohol/week: 0.0 standard drinks  . Drug use: No  . Sexual activity: Not Currently  Lifestyle  . Physical activity:    Days per week: 4 days  Minutes per session: 30 min  . Stress: Only a little  Relationships  . Social connections:    Talks on phone: More than three times a week    Gets together: More than three times a week    Attends religious service: More than 4 times per year    Active member of club or organization: Yes    Attends meetings of clubs or organizations: More than 4 times per year    Relationship status: Separated  . Intimate partner violence:    Fear of current or ex partner: No    Emotionally abused: No    Physically abused: No    Forced sexual activity: No  Other Topics Concern  . Not on file  Social History Narrative  . Not on file     Current Outpatient Medications:  .  cholecalciferol (VITAMIN D) 1000 units tablet, Take 1,000 Units daily by mouth., Disp: , Rfl:  .  cetirizine (ZYRTEC) 10 MG tablet, Take 1 tablet (10 mg total) by mouth daily. (Patient not taking: Reported on 02/09/2018), Disp: 30 tablet, Rfl: 11 .  rosuvastatin (CRESTOR) 10 MG tablet, Take 1 tablet (10 mg total) by mouth daily. (Patient not taking: Reported on 02/09/2018), Disp: 90 tablet, Rfl: 1  No Known Allergies   ROS  Constitutional: Negative for fever or weight change.  Respiratory: Negative for cough and shortness of breath.   Cardiovascular: Negative for chest pain or palpitations.  Gastrointestinal: Negative for abdominal pain, no bowel changes.  Musculoskeletal: Negative for gait problem or joint swelling.  Skin: Negative for rash.  Neurological: Negative for dizziness or headache.  No other specific complaints in a complete review of systems (except as listed in HPI above).   Objective  Vitals:   04/07/18 1340  BP: 130/80  Pulse: 80   Resp: 16  Temp: 98.5 F (36.9 C)  TempSrc: Oral  SpO2: 98%  Weight: (!) 304 lb 3.2 oz (138 kg)  Height: 5\' 9"  (1.753 m)    Body mass index is 44.92 kg/m.  Physical Exam Constitutional: Patient appears well-developed and well-nourished. No distress.  HENT: Head: Normocephalic and atraumatic. Ears: bilateral TMs with no erythema or effusion; Nose: Nose normal. Mouth/Throat: Oropharynx is clear and moist. No oropharyngeal exudate or tonsillar swelling.  Eyes: Conjunctivae and EOM are normal. No scleral icterus.  Pupils are equal, round, and reactive to light.  Neck: Normal range of motion. Neck supple. No JVD present. No thyromegaly present.  Cardiovascular: Normal rate, regular rhythm and normal heart sounds.  No murmur heard. No BLE edema. Pulmonary/Chest: Effort normal and breath sounds normal. No respiratory distress. Abdominal: Soft. Bowel sounds are normal, no distension. There is no tenderness. No masses. Musculoskeletal: Normal range of motion, no joint effusions. No gross deformities Neurological: Pt is alert and oriented to person, place, and time. No cranial nerve deficit. Coordination, balance, strength, speech and gait are normal.  Skin: Skin is warm and dry. No rash noted. No erythema.  Psychiatric: Patient has a normal mood and affect. behavior is normal. Judgment and thought content normal.  Recent Results (from the past 2160 hour(s))  Hemoglobin A1c     Status: Abnormal   Collection Time: 02/07/18 11:37 AM  Result Value Ref Range   Hgb A1c MFr Bld 6.7 (H) <5.7 % of total Hgb    Comment: For someone without known diabetes, a hemoglobin A1c value of 6.5% or greater indicates that they may have  diabetes and this should be confirmed with a  follow-up  test. . For someone with known diabetes, a value <7% indicates  that their diabetes is well controlled and a value  greater than or equal to 7% indicates suboptimal  control. A1c targets should be individualized based  on  duration of diabetes, age, comorbid conditions, and  other considerations. . Currently, no consensus exists regarding use of hemoglobin A1c for diagnosis of diabetes for children. .    Mean Plasma Glucose 146 (calc)   eAG (mmol/L) 8.1 (calc)  COMPLETE METABOLIC PANEL WITH GFR     Status: None   Collection Time: 02/07/18 11:37 AM  Result Value Ref Range   Glucose, Bld 87 65 - 139 mg/dL    Comment: .        Non-fasting reference interval .    BUN 14 7 - 25 mg/dL   Creat 1.11 0.70 - 1.33 mg/dL    Comment: For patients >48 years of age, the reference limit for Creatinine is approximately 13% higher for people identified as African-American. .    GFR, Est Non African American 73 > OR = 60 mL/min/1.35m2   GFR, Est African American 84 > OR = 60 mL/min/1.54m2   BUN/Creatinine Ratio NOT APPLICABLE 6 - 22 (calc)   Sodium 139 135 - 146 mmol/L   Potassium 4.4 3.5 - 5.3 mmol/L   Chloride 105 98 - 110 mmol/L   CO2 27 20 - 32 mmol/L   Calcium 9.3 8.6 - 10.3 mg/dL   Total Protein 7.0 6.1 - 8.1 g/dL   Albumin 4.1 3.6 - 5.1 g/dL   Globulin 2.9 1.9 - 3.7 g/dL (calc)   AG Ratio 1.4 1.0 - 2.5 (calc)   Total Bilirubin 0.5 0.2 - 1.2 mg/dL   Alkaline phosphatase (APISO) 87 40 - 115 U/L   AST 24 10 - 35 U/L   ALT 23 9 - 46 U/L  Lipid panel     Status: Abnormal   Collection Time: 02/07/18 11:37 AM  Result Value Ref Range   Cholesterol 142 <200 mg/dL   HDL 38 (L) >40 mg/dL   Triglycerides 104 <150 mg/dL   LDL Cholesterol (Calc) 84 mg/dL (calc)    Comment: Reference range: <100 . Desirable range <100 mg/dL for primary prevention;   <70 mg/dL for patients with CHD or diabetic patients  with > or = 2 CHD risk factors. Marland Kitchen LDL-C is now calculated using the Martin-Hopkins  calculation, which is a validated novel method providing  better accuracy than the Friedewald equation in the  estimation of LDL-C.  Cresenciano Genre et al. Annamaria Helling. 3710;626(94): 2061-2068   (http://education.[facility].com/faq/FAQ164)    Total CHOL/HDL Ratio 3.7 <5.0 (calc)   Non-HDL Cholesterol (Calc) 104 <130 mg/dL (calc)    Comment: For patients with diabetes plus 1 major ASCVD risk  factor, treating to a non-HDL-C goal of <100 mg/dL  (LDL-C of <70 mg/dL) is considered a therapeutic  option.   TSH     Status: None   Collection Time: 02/07/18 11:37 AM  Result Value Ref Range   TSH 0.91 0.40 - 4.50 mIU/L  Vitamin D (25 hydroxy)     Status: None   Collection Time: 02/07/18 11:37 AM  Result Value Ref Range   Vit D, 25-Hydroxy 31 30 - 100 ng/mL    Comment: Vitamin D Status         25-OH Vitamin D: . Deficiency:                    <20 ng/mL  Insufficiency:             20 - 29 ng/mL Optimal:                 > or = 30 ng/mL . For 25-OH Vitamin D testing on patients on  D2-supplementation and patients for whom quantitation  of D2 and D3 fractions is required, the QuestAssureD(TM) 25-OH VIT D, (D2,D3), LC/MS/MS is recommended: order  code 505 [id number] 3315 (patients >85yrs). . For more information on this test, go to: http://education.[facility].com/faq/FAQ163 (This link is being provided for  informational/educational purposes only.)   HIV Antibody (routine testing w rflx)     Status: None   Collection Time: 02/07/18 11:37 AM  Result Value Ref Range   HIV 1&2 Ab, 4th Generation NON-REACTIVE NON-REACTI    Comment: HIV-1 antigen and HIV-1/HIV-2 antibodies were not detected. There is no laboratory evidence of HIV infection. Marland Kitchen PLEASE NOTE: This information has been disclosed to you from records whose confidentiality may be protected by state law.  If your state requires such protection, then the state law prohibits you from making any further disclosure of the information without the specific written consent of the person to whom it pertains, or as otherwise permitted by law. A general authorization for the release of medical or other information is NOT  sufficient for this purpose. . For additional information please refer to http://education.[facility].com/faq/FAQ106 (This link is being provided for informational/ educational purposes only.) . Marland Kitchen The performance of this assay has not been clinically validated in patients less than 39 years old. .   Hepatitis C antibody     Status: None   Collection Time: 02/07/18 11:37 AM  Result Value Ref Range   Hepatitis C Ab NON-REACTIVE NON-REACTI   SIGNAL TO CUT-OFF 0.02 <1.00    Comment: . HCV antibody was non-reactive. There is no laboratory  evidence of HCV infection. . In most cases, no further action is required. However, if recent HCV exposure is suspected, a test for HCV RNA (test code 3043232633) is suggested. . For additional information please refer to http://education.[facility].com/faq/FAQ22v1 (This link is being provided for informational/ educational purposes only.) .   PSA     Status: None   Collection Time: 02/07/18 11:37 AM  Result Value Ref Range   PSA 0.6 < OR = 4.0 ng/mL    Comment: The total PSA value from this assay system is  standardized against the WHO standard. The test  result will be approximately 20% lower when compared  to the equimolar-standardized total PSA (Beckman  Coulter). Comparison of serial PSA results should be  interpreted with this fact in mind. . This test was performed using the Siemens  chemiluminescent method. Values obtained from  different assay methods cannot be used interchangeably. PSA levels, regardless of value, should not be interpreted as absolute evidence of the presence or absence of disease.   CBC w/Diff/Platelet     Status: None   Collection Time: 02/07/18 11:37 AM  Result Value Ref Range   WBC 5.5 3.8 - 10.8 Thousand/uL   RBC 5.04 4.20 - 5.80 Million/uL   Hemoglobin 14.4 13.2 - 17.1 g/dL   HCT 43.2 38.5 - 50.0 %   MCV 85.7 80.0 - 100.0 fL   MCH 28.6 27.0 - 33.0 pg   MCHC 33.3 32.0 - 36.0 g/dL   RDW 13.9  11.0 - 15.0 %   Platelets 328 140 - 400 Thousand/uL   MPV 10.4 7.5 - 12.5 fL   Neutro Abs 2,921  1,500 - 7,800 cells/uL   Lymphs Abs 1,881 850 - 3,900 cells/uL   WBC mixed population 479 200 - 950 cells/uL   Eosinophils Absolute 182 15 - 500 cells/uL   Basophils Absolute 39 0 - 200 cells/uL   Neutrophils Relative % 53.1 %   Total Lymphocyte 34.2 %   Monocytes Relative 8.7 %   Eosinophils Relative 3.3 %   Basophils Relative 0.7 %     PHQ2/9: Depression screen [facility] 2/9 04/07/2018 02/28/2018 02/07/2018 04/01/2015  Decreased Interest 0 0 0 0  Down, Depressed, Hopeless 0 0 0 0  PHQ - 2 Score 0 0 0 0  Altered sleeping 0 - 0 -  Tired, decreased energy 0 - 0 -  Change in appetite 0 - 0 -  Feeling bad or failure about yourself  0 - 0 -  Trouble concentrating 0 - 0 -  Moving slowly or fidgety/restless 0 - - -  Suicidal thoughts 0 - 0 -  PHQ-9 Score 0 - 0 -  Difficult doing work/chores Not difficult at all - Not difficult at all -   Fall Risk: Fall Risk  04/07/2018 04/04/2018 03/28/2018 03/21/2018 02/28/2018  Falls in the past year? 0 0 (No Data) 0 0  Comment - - no falls since last visit - -  Number falls in past yr: 0 - - - -  Injury with Fall? 0 - - - -  Follow up Falls evaluation completed - - - -   Assessment & Plan  1. Annual physical exam -Prostate cancer screening and PSA options (with potential risks and benefits of testing vs not testing) were discussed along with recent recs/guidelines. -USPSTF grade A and B recommendations reviewed with patient; age-appropriate recommendations, preventive care, screening tests, etc discussed and encouraged; healthy living encouraged; see AVS for patient education given to patient -Discussed importance of 150 minutes of physical activity weekly, eat two servings of fish weekly, eat one serving of tree nuts ( cashews, pistachios, pecans, almonds.Marland Kitchen) every other day, eat 6 servings of fruit/vegetables daily and drink plenty of water and avoid sweet  beverages.

## 2018-04-07 NOTE — Patient Instructions (Signed)
Preventive Care 40-64 Years, Male Preventive care refers to lifestyle choices and visits with your health care provider that can promote health and wellness. What does preventive care include?   A yearly physical exam. This is also called an annual well check.  Dental exams once or twice a year.  Routine eye exams. Ask your health care provider how often you should have your eyes checked.  Personal lifestyle choices, including: ? Daily care of your teeth and gums. ? Regular physical activity. ? Eating a healthy diet. ? Avoiding tobacco and drug use. ? Limiting alcohol use. ? Practicing safe sex. ? Taking low-dose aspirin every day starting at age 50. What happens during an annual well check? The services and screenings done by your health care provider during your annual well check will depend on your age, overall health, lifestyle risk factors, and family history of disease. Counseling Your health care provider may ask you questions about your:  Alcohol use.  Tobacco use.  Drug use.  Emotional well-being.  Home and relationship well-being.  Sexual activity.  Eating habits.  Work and work environment. Screening You may have the following tests or measurements:  Height, weight, and BMI.  Blood pressure.  Lipid and cholesterol levels. These may be checked every 5 years, or more frequently if you are over 50 years old.  Skin check.  Lung cancer screening. You may have this screening every year starting at age 55 if you have a 30-pack-year history of smoking and currently smoke or have quit within the past 15 years.  Colorectal cancer screening. All adults should have this screening starting at age 50 and continuing until age 75. Your health care provider may recommend screening at age 45. You will have tests every 1-10 years, depending on your results and the type of screening test. People at increased risk should start screening at an earlier age. Screening tests may  include: ? Guaiac-based fecal occult blood testing. ? Fecal immunochemical test (FIT). ? Stool DNA test. ? Virtual colonoscopy. ? Sigmoidoscopy. During this test, a flexible tube with a tiny camera (sigmoidoscope) is used to examine your rectum and lower colon. The sigmoidoscope is inserted through your anus into your rectum and lower colon. ? Colonoscopy. During this test, a long, thin, flexible tube with a tiny camera (colonoscope) is used to examine your entire colon and rectum.  Prostate cancer screening. Recommendations will vary depending on your family history and other risks.  Hepatitis C blood test.  Hepatitis B blood test.  Sexually transmitted disease (STD) testing.  Diabetes screening. This is done by checking your blood sugar (glucose) after you have not eaten for a while (fasting). You may have this done every 1-3 years. Discuss your test results, treatment options, and if necessary, the need for more tests with your health care provider. Vaccines Your health care provider may recommend certain vaccines, such as:  Influenza vaccine. This is recommended every year.  Tetanus, diphtheria, and acellular pertussis (Tdap, Td) vaccine. You may need a Td booster every 10 years.  Varicella vaccine. You may need this if you have not been vaccinated.  Zoster vaccine. You may need this after age 60.  Measles, mumps, and rubella (MMR) vaccine. You may need at least one dose of MMR if you were born in 1957 or later. You may also need a second dose.  Pneumococcal 13-valent conjugate (PCV13) vaccine. You may need this if you have certain conditions and have not been vaccinated.  Pneumococcal polysaccharide (PPSV23) vaccine.   and rubella (MMR) vaccine. You may need at least one dose of MMR if you were born in 1958 or later. You may also need a second dose.   Pneumococcal 13-valent conjugate (PCV13) vaccine. You may need this if you have certain conditions and have not been vaccinated.   Pneumococcal polysaccharide (PPSV23) vaccine. You may need one or two doses if you smoke cigarettes or if you have certain conditions.   Meningococcal vaccine. You may need this if you have certain conditions.   Hepatitis A vaccine. You may need this if you have certain conditions or if you travel or work in  places where you may be exposed to hepatitis A.   Hepatitis B vaccine. You may need this if you have certain conditions or if you travel or work in places where you may be exposed to hepatitis B.   Haemophilus influenzae type b (Hib) vaccine. You may need this if you have certain risk factors.  Talk to your health care provider about which screenings and vaccines you need and how often you need them.  This information is not intended to replace advice given to you by your health care provider. Make sure you discuss any questions you have with your health care provider.  Document Released: 03/29/2015 Document Revised: 04/22/2017 Document Reviewed: 01/01/2015  [employer]vier Interactive Patient Education  2019 [employer]

## 2018-05-05 DIAGNOSIS — G4733 Obstructive sleep apnea (adult) (pediatric): Secondary | ICD-10-CM | POA: Diagnosis not present

## 2018-05-19 ENCOUNTER — Ambulatory Visit: Payer: BLUE CROSS/BLUE SHIELD | Admitting: Family Medicine

## 2018-05-30 ENCOUNTER — Ambulatory Visit: Payer: BLUE CROSS/BLUE SHIELD | Admitting: Family Medicine

## 2018-05-30 ENCOUNTER — Other Ambulatory Visit: Payer: Self-pay

## 2018-05-30 ENCOUNTER — Encounter: Payer: Self-pay | Admitting: Family Medicine

## 2018-05-30 VITALS — BP 138/78 | HR 77 | Temp 98.3°F | Resp 18 | Ht 69.0 in | Wt 303.7 lb

## 2018-05-30 DIAGNOSIS — Z6841 Body Mass Index (BMI) 40.0 and over, adult: Secondary | ICD-10-CM

## 2018-05-30 DIAGNOSIS — B351 Tinea unguium: Secondary | ICD-10-CM

## 2018-05-30 DIAGNOSIS — Z23 Encounter for immunization: Secondary | ICD-10-CM

## 2018-05-30 DIAGNOSIS — J302 Other seasonal allergic rhinitis: Secondary | ICD-10-CM | POA: Diagnosis not present

## 2018-05-30 DIAGNOSIS — E785 Hyperlipidemia, unspecified: Secondary | ICD-10-CM

## 2018-05-30 DIAGNOSIS — G4733 Obstructive sleep apnea (adult) (pediatric): Secondary | ICD-10-CM | POA: Diagnosis not present

## 2018-05-30 DIAGNOSIS — Z800 Family history of malignant neoplasm of digestive organs: Secondary | ICD-10-CM

## 2018-05-30 DIAGNOSIS — H9319 Tinnitus, unspecified ear: Secondary | ICD-10-CM

## 2018-05-30 DIAGNOSIS — E1169 Type 2 diabetes mellitus with other specified complication: Secondary | ICD-10-CM

## 2018-05-30 DIAGNOSIS — B353 Tinea pedis: Secondary | ICD-10-CM

## 2018-05-30 DIAGNOSIS — E119 Type 2 diabetes mellitus without complications: Secondary | ICD-10-CM | POA: Diagnosis not present

## 2018-05-30 LAB — POCT GLYCOSYLATED HEMOGLOBIN (HGB A1C): HbA1c, POC (prediabetic range): 6.2 % (ref 5.7–6.4)

## 2018-05-30 MED ORDER — FEXOFENADINE HCL 180 MG PO TABS: 180.0000 mg | ORAL_TABLET | Freq: Every day | ORAL | 1 refills | Status: DC

## 2018-05-30 MED ORDER — ROSUVASTATIN CALCIUM 10 MG PO TABS: 10.0000 mg | ORAL_TABLET | Freq: Every day | ORAL | 1 refills | Status: DC

## 2018-05-30 MED ORDER — FLUTICASONE PROPIONATE 50 MCG/ACT NA SUSP: 2.0000 | Freq: Every day | NASAL | 6 refills | Status: DC

## 2018-05-30 NOTE — Patient Instructions (Addendum)
Sleep Hygiene Tips 1) Get regular. One of the best ways to train your body to sleep well is to go to bed and get up at more or less the same time every day, even on weekends and days off! This regular rhythm will make you feel better and will give your body something to work from. 2) Sleep when sleepy. Only try to sleep when you actually feel tired or sleepy, rather than spending too much time awake in bed. 3) Get up & try again. If you haven't been able to get to sleep after about 20 minutes or more, get up and do something calming or boring until you feel sleepy, then return to bed and try again. Sit quietly on the couch with the lights off (bright light will tell your brain that it is time to wake up), or read something boring like the phone book. Avoid doing anything that is too stimulating or interesting, as this will wake you up even more. 4) Avoid caffeine & nicotine. It is best to avoid consuming any caffeine (in coffee, tea, cola drinks, chocolate, and some medications) or nicotine (cigarettes) for at least 4-6 hours before going to bed. These substances act as stimulants and interfere with the ability to fall asleep 5) Avoid alcohol. It is also best to avoid alcohol for at least 4-6 hours before going to bed. Many people believe that alcohol is relaxing and helps them to get to sleep at first, but it actually interrupts the quality of sleep. 6) Bed is for sleeping. Try not to use your bed for anything other than sleeping and sex, so that your body comes to associate bed with sleep. If you use bed as a place to watch TV, eat, read, work on your laptop, pay bills, and other things, your body will not learn this Connection. 7) No naps. It is best to avoid taking naps during the day, to make sure that you are tired at bedtime. If you can't make it through the day without a nap, make sure it is for less than an hour and before 3pm. 8) Sleep rituals. You can develop your  own rituals of things to remind your body that it is time to sleep - some people find it useful to do relaxing stretches or breathing exercises for 15 minutes before bed each night, or sit calmly with a cup of caffeine-free tea. 9) Bathtime. Having a hot bath 1-2 hours before bedtime can be useful, as it will raise your body temperature, causing you to feel sleepy as your body temperature drops again. Research shows that sleepiness is associated with a drop in body temperature. 10) No clock-watching. Many people who struggle with sleep tend to watch the clock too much. Frequently checking the clock during the night can wake you up (especially if you turn on the light to read the time) and reinforces negative thoughts such as "Oh no, look how late it is, I'll never get to sleep" or "it's so early, I have only slept for 5 hours, this is terrible." 11) Use a sleep diary. This worksheet can be a useful way of making sure you have the right facts about your sleep, rather than making assumptions. Because a diary involves watching the clock (see point 10) it is a good idea to only use it for two weeks to get an idea of what is going and then perhaps two months down the track to see how you are progressing. 12) Exercise. Regular exercise is  a good idea to help with good sleep, but try not to do strenuous exercise in the 4 hours before bedtime. Morning walks are a great way to start the day feeling refreshed! 13) Eat right. A healthy, balanced diet will help you to sleep well, but timing is important. Some people find that a very empty stomach at bedtime is distracting, so it can be useful to have a light snack, but a heavy meal soon before bed can also interrupt sleep. Some people recommend a warm glass of milk, which contains tryptophan, which acts as a natural sleep inducer. 14) The right space. It is very important that your bed and bedroom are quiet and comfortable for sleeping. A  cooler room with enough blankets to stay warm is best, and make sure you have curtains or an eyemask to block out early morning light and earplugs if there is noise outside your room. 15) Keep daytime routine the same. Even if you have a bad night sleep and are tired it is important that you try to keep your daytime activities the same as you had planned. That is, don't avoid activities because you feel tired. This can reinforce the insomnia.  Coronavirus (COVID-19) Are you at risk?  Are you at risk for the Coronavirus (COVID-19)?  To be considered HIGH RISK for Coronavirus (COVID-19), you have to meet the following criteria:  . Traveled to [place], [place], [place], [place] or [place]; or in the [place] to [place], [place], [place], or [place]; and have fever, cough, and shortness of breath within the last 2 weeks of travel OR . Been in close contact with a person diagnosed with COVID-19 within the last 2 weeks and have fever, cough, and shortness of breath . IF YOU DO NOT MEET THESE CRITERIA, YOU ARE CONSIDERED LOW RISK FOR COVID-19.  What to do if you are HIGH RISK for COVID-19?  Marland Kitchen If you are having a medical emergency, call 911. . Seek medical care right away. Before you go to a doctor's office, urgent care or emergency department, call ahead and tell them about your recent travel, contact with someone diagnosed with COVID-19, and your symptoms. You should receive instructions from your physician's office regarding next steps of care.  . When you arrive at healthcare provider, tell the healthcare staff immediately you have returned from visiting [place], [place], [place], [place] or [place]; or traveled in the [place] to [place], [place], [place], or [place]; in the last two weeks or you have been in close contact with a person diagnosed with COVID-19 in the last 2 weeks.   . Tell the health care staff about your symptoms: fever, cough and shortness of  breath. . After you have been seen by a medical provider, you will be either: o Tested for (COVID-19) and discharged home on quarantine except to seek medical care if symptoms worsen, and asked to  - Stay home and avoid contact with others until you get your results (4-5 days)  - Avoid travel on public transportation if possible (such as bus, train, or airplane) or o Sent to the Emergency Department by EMS for evaluation, COVID-19 testing, and possible admission depending on your condition and test results.  What to do if you are LOW RISK for COVID-19?  Reduce your risk of any infection by using the same precautions used for avoiding the common cold or flu:  Marland Kitchen Wash your hands often with soap and warm water for at  least 20 seconds.  If soap and water are not readily available, use an alcohol-based hand sanitizer with at least 60% alcohol.  . If coughing or sneezing, cover your mouth and nose by coughing or sneezing into the elbow areas of your shirt or coat, into a tissue or into your sleeve (not your hands). . Avoid shaking hands with others and consider head nods or verbal greetings only. . Avoid touching your eyes, nose, or mouth with unwashed hands.  . Avoid close contact with people who are sick. . Avoid places or events with large numbers of people in one location, like concerts or sporting events. . Carefully consider travel plans you have or are making. . If you are planning any travel outside or inside the [place], visit the CDC's Travelers' Health webpage for the latest health notices. . If you have some symptoms but not all symptoms, continue to monitor at home and seek medical attention if your symptoms worsen. . If you are having a medical emergency, call 911.   [place] / e-Visit: [email or web address]         [facility]: [facility]: [phone]                    [facility]: [phone]

## 2018-05-30 NOTE — Progress Notes (Signed)
Name: John York   MRN: [id number]    DOB: 05/16/1960   Date:05/31/2018       Progress Note  Subjective  Chief Complaint  Chief Complaint  Patient presents with  . Follow-up    6 week recheck    HPI  OSA: Was diagnosed many years ago; was never given a CPAP machine - he's not sure if he was supposed to have a machine or not.  Denies daytime sleepiness; does snore loudly per family member's reports. He is also obese. He did see Dr. Mortimer Fries after his last follow up and now has CPAP that he uses nightly.  It takes a while to go to sleep, but has no issues after he falls asleep.   Obesity: Body mass index is 44.85 kg/m.  Has been doing the elliptical, no LEFT knee pain lately.  He eats a balanced diet, trying to limit portions more, is down a few pounds since last follow up.Marland Kitchen  Hx Colonic Polyps and family history of colorectal cancer (Brother - passed away in his 41's from this). Screening is UTD - repeat in 2023.  Tinnitus: Has been ongoing but only notices at night when it's quiet; seeing [facility] and has been having hearing testing and no medication at this time. No dizziness with the tinnitus, but does have history of vertigo with no recent attacks.  Unchanged.   Diabetes: Due for foot exam, urine micro, and A1C.   Diabetes mellitus type 2 Checking sugars?  yes How often? Couple of days a week.  Range (low to high) over last two weeks:  120's Does patient feel additional teaching/training would be helpful?  Already attended classes  Have they attended Diabetes education classes? yes  Trying to limit white bread, white rice, white potatoes, sweets?  yes Trying to limit sweetened drinks like iced tea, soft drinks, sports drinks, fruit juices?  yes Checking feet every day/night?  yes Last eye exam:  Last year, we will refer today. Denies: Polyuria, polydipsia, polyphagia, vision changes, or neuropathy. Most recent A1C:  Lab Results  Component Value Date   HGBA1C 6.2  05/30/2018    We will recheck today. Last CMP Results : is not due for repeat today Urine Micro UTD? No Current Medication Management: Diabetic Medications:  ACEI/ARB: No Statin: Yes Aspirin therapy: Not Indicated   Nasal congestion/URI: Started a few days ago with nasal congestion, does have mild cough with PND.  No sore throat, ear pain/pressure, chest pain, shortness of breath. No recent travel, no recent contact with anyone with Covid-19.  Does get seasonal allergies  (first time was last year) - this feels similar to his allergies  HLD: Ordered statin therapy after diagnosis of diabetes, never filled the medication, we will send new script today.  We will recheck in 3 months once he is on crestor for 3 months.   Health Maintenance - we will check labs today and schedule CPE.  TDAP today.  Patient Active Problem List   Diagnosis Date Noted  . Seasonal allergic rhinitis 05/30/2018  . Diet-controlled diabetes mellitus ([place]) 02/08/2018  . Hyperlipidemia associated with type 2 diabetes mellitus ([place]) 02/08/2018  . Vertigo 02/07/2018  . OSA (obstructive sleep apnea) 02/07/2018  . Tinnitus 06/16/2017  . Obesity 09/25/2014  . FHx: migraine headaches 09/25/2014  . Other specified joint disorders, unspecified shoulder 04/16/2014  . 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 Family history of malignant neoplasm of gastrointestinal tract   . Personal history of colonic polyps  Past Surgical History:  Procedure Laterality Date  . COLONOSCOPY  02/03/2012   Dr. Jamal Collin  . COLONOSCOPY W/ POLYPECTOMY  2008   63mm size sessile non bleeding polyp was found in the sigmoid colon  . COLONOSCOPY WITH PROPOFOL N/A 02/24/2017   Procedure: COLONOSCOPY WITH PROPOFOL;  Surgeon: Christene Lye, MD;  Location: [facility] ENDOSCOPY;  Service: Endoscopy;  Laterality: N/A;    Family History  Problem Relation Age of Onset  . Cancer Sister        breast  . Colon cancer Brother   . Diabetes Father      Social History   Socioeconomic History  . Marital status: Single    Spouse name: Not on file  . Number of children: 2  . Years of education: Not on file  . 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 Financial resource strain: Not hard at all  . Food insecurity:    Worry: Never true    Inability: Never true  . Transportation needs:    Medical: No    Non-medical: No  Tobacco Use  . Smoking status: Never Smoker  . Smokeless tobacco: Never Used  Substance and Sexual Activity  . Alcohol use: No    Alcohol/week: 0.0 standard drinks  . Drug use: No  . Sexual activity: Not Currently  Lifestyle  . Physical activity:    Days per week: 4 days    Minutes per session: 30 min  . Stress: Only a little  Relationships  . Social connections:    Talks on phone: More than three times a week    Gets together: More than three times a week    Attends religious service: More than 4 times per year    Active member of club or organization: Yes    Attends meetings of clubs or organizations: More than 4 times per year    Relationship status: Separated  . Intimate partner violence:    Fear of current or ex partner: No    Emotionally abused: No    Physically abused: No    Forced sexual activity: No  Other Topics Concern  . Not on file  Social History Narrative  . Not on file     Current Outpatient Medications:  .  cholecalciferol (VITAMIN D) 1000 units tablet, Take 1,000 Units daily by mouth., Disp: , Rfl:  .  fexofenadine (ALLEGRA ALLERGY) 180 MG tablet, Take 1 tablet (180 mg total) by mouth daily., Disp: 90 tablet, Rfl: 1 .  fluticasone (FLONASE) 50 MCG/ACT nasal spray, Place 2 sprays into both nostrils daily., Disp: 16 g, Rfl: 6 .  rosuvastatin (CRESTOR) 10 MG tablet, Take 1 tablet (10 mg total) by mouth daily., Disp: 90 tablet, Rfl: 1  No Known Allergies  I personally reviewed active problem list, medication  list, allergies, health maintenance, lab results with the patient/caregiver today.   ROS  Constitutional: Negative for fever or weight change.  Respiratory: Negative for cough and shortness of breath.   Cardiovascular: Negative for chest pain or palpitations.  Gastrointestinal: Negative for abdominal pain, no bowel changes.  Musculoskeletal: Negative for gait problem or joint swelling.  Skin: Negative for rash.  Neurological: Negative for dizziness or headache.  No other specific complaints in a complete review of systems (except as listed in HPI above).  Objective  Vitals:   05/30/18 1257  BP: 138/78  Pulse: 77  Resp: 18  Temp:  98.3 F (36.8 C)  TempSrc: Oral  SpO2: 97%  Weight: (!) 303 lb 11.2 oz (137.8 kg)  Height: 5\' 9"  (1.753 m)   Body mass index is 44.85 kg/m.  Physical Exam Constitutional: Patient appears well-developed and well-nourished. No distress.  HENT: Head: Normocephalic and atraumatic. Ears: bilateral TMs with no erythema or effusion; Nose: Nose normal.  Eyes: Conjunctivae and EOM are normal. No scleral icterus.  Pupils are equal, round, and reactive to light.  Neck: Normal range of motion. Neck supple. No JVD present. No thyromegaly present.  Cardiovascular: Normal rate, regular rhythm and normal heart sounds.  No murmur heard. No BLE edema. Pulmonary/Chest: Effort normal and breath sounds normal. No respiratory distress. Abdominal: Soft. Bowel sounds are normal, no distension. There is no tenderness. No masses. Musculoskeletal: Normal range of motion, no joint effusions. No gross deformities Neurological: Pt is alert and oriented to person, place, and time. No cranial nerve deficit. Coordination, balance, strength, speech and gait are normal.  Skin: see foot examination for details. Psychiatric: Patient has a normal mood and affect. behavior is normal. Judgment and thought content normal.  Results for orders placed or performed in visit on 05/30/18 (from  the past 72 hour(s))  Urine Microalbumin w/creat. ratio     Status: None   Collection Time: 05/30/18  1:53 PM  Result Value Ref Range   Creatinine, Urine 173 20 - 320 mg/dL   Microalb, Ur 1.0 mg/dL    Comment: Reference Range Not established    Microalb Creat Ratio 6 <30 mcg/mg creat    Comment: . The ADA defines abnormalities in albumin excretion as follows: Marland Kitchen Category         Result (mcg/mg creatinine) . Normal                    <30 Microalbuminuria         30-299  Clinical albuminuria   > OR = 300 . The ADA recommends that at least two of three specimens collected within a 3-6 month period be abnormal before considering a patient to be within a diagnostic category.   POCT glycosylated hemoglobin (Hb A1C)     Status: None   Collection Time: 05/30/18  1:53 PM  Result Value Ref Range   Hemoglobin A1C     HbA1c POC (<> result, manual entry)     HbA1c, POC (prediabetic range) 6.2 5.7 - 6.4 %   HbA1c, POC (controlled diabetic range)      Diabetic Foot Exam: Diabetic Foot Exam - Simple   Simple Foot Form Diabetic Foot exam was performed with the following findings:  Yes 05/30/2018  1:31 PM  Visual Inspection See comments:  Yes Sensation Testing Intact to touch and monofilament testing bilaterally:  Yes Pulse Check Posterior Tibialis and Dorsalis pulse intact bilaterally:  Yes Comments Bilateral onychomycosis and tinea pedis present.  There is some maceration between bilateral 4th and 5th toes.     PHQ2/9: Depression screen [facility] 2/9 05/30/2018 04/07/2018 02/28/2018 02/07/2018 04/01/2015  Decreased Interest 0 0 0 0 0  Down, Depressed, Hopeless 0 0 0 0 0  PHQ - 2 Score 0 0 0 0 0  Altered sleeping 0 0 - 0 -  Tired, decreased energy 0 0 - 0 -  Change in appetite 0 0 - 0 -  Feeling bad or failure about yourself  0 0 - 0 -  Trouble concentrating 0 0 - 0 -  Moving slowly or fidgety/restless 0 0 - - -  Suicidal thoughts 0 0 - 0 -  PHQ-9 Score 0 0 - 0 -  Difficult doing  work/chores Not difficult at all Not difficult at all - Not difficult at all -   PHQ-2/9 Result is negative.    Fall Risk: Fall Risk  05/30/2018 04/07/2018 04/04/2018 03/28/2018 03/21/2018  Falls in the past year? 0 0 0 (No Data) 0  Comment - - - no falls since last visit -  Number falls in past yr: 0 0 - - -  Injury with Fall? 0 0 - - -  Follow up Falls evaluation completed Falls evaluation completed - - -   Assessment & Plan  1. OSA (obstructive sleep apnea) - Continue with CPAP, sleep hygiene is discussed.  2. Hyperlipidemia associated with type 2 diabetes mellitus ([place]) - Discussed diet with patient in detail. - rosuvastatin (CRESTOR) 10 MG tablet; Take 1 tablet (10 mg total) by mouth daily.  Dispense: 90 tablet; Refill: 1  3. Diet-controlled diabetes mellitus ([place]) - Education provided today regarding diet. - Urine Microalbumin w/creat. ratio - Ambulatory referral to Ophthalmology - rosuvastatin (CRESTOR) 10 MG tablet; Take 1 tablet (10 mg total) by mouth daily.  Dispense: 90 tablet; Refill: 1 - Ambulatory referral to Podiatry - POCT glycosylated hemoglobin (Hb A1C)  4. Seasonal allergic rhinitis, unspecified trigger - fexofenadine (ALLEGRA ALLERGY) 180 MG tablet; Take 1 tablet (180 mg total) by mouth daily.  Dispense: 90 tablet; Refill: 1 - fluticasone (FLONASE) 50 MCG/ACT nasal spray; Place 2 sprays into both nostrils daily.  Dispense: 16 g; Refill: 6  5. Tinnitus, unspecified laterality - Continue ENT follow up  6. Class 3 severe obesity due to excess calories with serious comorbidity and body mass index (BMI) of 40.0 to 44.9 in adult [facility]) - Discussed importance of 150 minutes of physical activity weekly, eat two servings of fish weekly, eat one serving of tree nuts ( cashews, pistachios, pecans, almonds.Marland Kitchen) every other day, eat 6 servings of fruit/vegetables daily and drink plenty of water and avoid sweet beverages.   7. Family history of malignant neoplasm of  gastrointestinal tract - Stable, unchanged  8. Hyperlipidemia associated with type 2 diabetes mellitus (HCC) - rosuvastatin (CRESTOR) 10 MG tablet; Take 1 tablet (10 mg total) by mouth daily.  Dispense: 90 tablet; Refill: 1  9. Tinea pedis of both feet - Ambulatory referral to Podiatry  10. Onychomycosis - Ambulatory referral to Podiatry

## 2018-05-31 LAB — MICROALBUMIN / CREATININE URINE RATIO
Creatinine, Urine: 173 mg/dL (ref 20–320)
Microalb Creat Ratio: 6 mcg/mg creat (ref <30)
Microalb, Ur: 1 mg/dL

## 2018-06-03 DIAGNOSIS — G4733 Obstructive sleep apnea (adult) (pediatric): Secondary | ICD-10-CM | POA: Diagnosis not present

## 2018-07-04 DIAGNOSIS — G4733 Obstructive sleep apnea (adult) (pediatric): Secondary | ICD-10-CM | POA: Diagnosis not present

## 2018-08-03 DIAGNOSIS — G4733 Obstructive sleep apnea (adult) (pediatric): Secondary | ICD-10-CM | POA: Diagnosis not present

## 2018-08-22 DIAGNOSIS — E119 Type 2 diabetes mellitus without complications: Secondary | ICD-10-CM | POA: Diagnosis not present

## 2018-08-30 ENCOUNTER — Encounter: Payer: Self-pay | Admitting: Family Medicine

## 2018-08-30 ENCOUNTER — Ambulatory Visit (INDEPENDENT_AMBULATORY_CARE_PROVIDER_SITE_OTHER): Payer: BC Managed Care – PPO | Admitting: Family Medicine

## 2018-08-30 ENCOUNTER — Other Ambulatory Visit: Payer: Self-pay

## 2018-08-30 DIAGNOSIS — R42 Dizziness and giddiness: Secondary | ICD-10-CM

## 2018-08-30 DIAGNOSIS — H9319 Tinnitus, unspecified ear: Secondary | ICD-10-CM | POA: Diagnosis not present

## 2018-08-30 DIAGNOSIS — E119 Type 2 diabetes mellitus without complications: Secondary | ICD-10-CM

## 2018-08-30 DIAGNOSIS — E1169 Type 2 diabetes mellitus with other specified complication: Secondary | ICD-10-CM

## 2018-08-30 DIAGNOSIS — Z6841 Body Mass Index (BMI) 40.0 and over, adult: Secondary | ICD-10-CM

## 2018-08-30 DIAGNOSIS — G4733 Obstructive sleep apnea (adult) (pediatric): Secondary | ICD-10-CM | POA: Diagnosis not present

## 2018-08-30 DIAGNOSIS — E785 Hyperlipidemia, unspecified: Secondary | ICD-10-CM

## 2018-08-30 DIAGNOSIS — B353 Tinea pedis: Secondary | ICD-10-CM

## 2018-08-30 MED ORDER — GLUCOSE BLOOD VI STRP: ORAL_STRIP | 12 refills | Status: AC

## 2018-08-30 NOTE — Progress Notes (Signed)
Name: John York   MRN: [id number]    DOB: [birth year]   Date:08/30/2018       Progress Note  Subjective  Chief Complaint  Chief Complaint  Patient presents with  . Follow-up  . Medication Refill    I connected with  [name]  on 08/30/18 at  1:00 PM EDT by a video enabled telemedicine application and verified that I am speaking with the correct person using two identifiers.  I discussed the limitations of evaluation and management by telemedicine and the availability of in person appointments. The patient expressed understanding and agreed to proceed. Staff also discussed with the patient that there may be a patient responsible charge related to this service. Patient Location: Home Provider Location: Office Additional Individuals present: None  HPI  OSA: Denies daytime sleepiness; does snore loudly per family member's reports. He is also obese. He did see Dr. Mortimer Fries after his last follow up and now has CPAP that he uses nightly.  It takes a while to go to sleep, but has no issues after he falls asleep.   Obesity:Body mass index is 44.85 kg/m at last visit.Has been doing the elliptical, no LEFT knee pain lately. He eats a balanced diet, trying to limit portions more. Unchanged  Tinnitus: Has been ongoing but only notices at night when it's quiet; seeing [facility] and has been having hearing testing and no medication at this time. No dizziness with the tinnitus, but does have history of vertigo with no recent attacks.  Stable, and unchanged.   Diabetes mellitus type 2 - Has always been diet controlled, but has seen a slight jump in his BG's lately.  Willing to take metformin if needed. Checking sugars?  yes  How often? A few times a week Range (low to high) over last two weeks:  138 - 150 fasting in the morning. Checking feet every day/night?  yes Last eye exam:  08/22/2018 Denies: Polyuria, polydipsia, polyphagia, vision changes, or neuropathy. Most recent  A1C:  Lab Results  Component Value Date   HGBA1C 6.2 05/30/2018    We will recheck today. Last CMP Results : is due for repeat today Urine Micro UTD? Yes Current Medication Management: Diabetic Medications:  ACEI/ARB: Not indicated Statin: Yes Aspirin therapy: Not Indicated  HLD: Ordered statin therapy after diagnosis of diabetes, never filled the medication, we will send new script today.  Due for labs today  Tinea Pedis: [facility] appointment [facility] to start therapy.  Health Maintenance - we will check labs today and schedule CPE.  Patient Active Problem List   Diagnosis Date Noted  . Seasonal allergic rhinitis 05/30/2018  . Diet-controlled diabetes mellitus ([place]) 02/08/2018  . Hyperlipidemia associated with type 2 diabetes mellitus ([place]) 02/08/2018  . Vertigo 02/07/2018  . OSA (obstructive sleep apnea) 02/07/2018  . Tinnitus 06/16/2017  . Obesity 09/25/2014  . FHx: migraine headaches 09/25/2014  . Other specified joint disorders, unspecified shoulder 04/16/2014  . 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 Family history of malignant neoplasm of gastrointestinal tract   . Personal history of colonic polyps     Past Surgical History:  Procedure Laterality Date  . COLONOSCOPY  02/03/2012   Dr. Jamal Collin  . COLONOSCOPY W/ POLYPECTOMY  2008   76mm size sessile non bleeding polyp was found in the sigmoid colon  . COLONOSCOPY WITH PROPOFOL N/A 02/24/2017   Procedure: COLONOSCOPY WITH PROPOFOL;  Surgeon: Christene Lye, MD;  Location: [facility] ENDOSCOPY;  Service:  Endoscopy;  Laterality: N/A;    Family History  Problem Relation Age of Onset  . Cancer Sister        breast  . Colon cancer Brother   . Diabetes Father     Social History   Socioeconomic History  . Marital status: Single    Spouse name: Not on file  . Number of children: 2  . Years of education: Not on file  . 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 Financial resource strain: Not hard at all  . Food insecurity    Worry: Never true    Inability: Never true  . Transportation needs    Medical: No    Non-medical: No  Tobacco Use  . Smoking status: Never Smoker  . Smokeless tobacco: Never Used  Substance and Sexual Activity  . Alcohol use: No    Alcohol/week: 0.0 standard drinks  . Drug use: No  . Sexual activity: Not Currently  Lifestyle  . Physical activity    Days per week: 4 days    Minutes per session: 30 min  . Stress: Only a little  Relationships  . Social connections    Talks on phone: More than three times a week    Gets together: More than three times a week    Attends religious service: More than 4 times per year    Active member of club or organization: Yes    Attends meetings of clubs or organizations: More than 4 times per year    Relationship status: Separated  . Intimate partner violence    Fear of current or ex partner: No    Emotionally abused: No    Physically abused: No    Forced sexual activity: No  Other Topics Concern  . Not on file  Social History Narrative  . Not on file     Current Outpatient Medications:  .  cholecalciferol (VITAMIN D) 1000 units tablet, Take 1,000 Units daily by mouth., Disp: , Rfl:  .  fexofenadine (ALLEGRA ALLERGY) 180 MG tablet, Take 1 tablet (180 mg total) by mouth daily., Disp: 90 tablet, Rfl: 1 .  fluticasone (FLONASE) 50 MCG/ACT nasal spray, Place 2 sprays into both nostrils daily., Disp: 16 g, Rfl: 6 .  rosuvastatin (CRESTOR) 10 MG tablet, Take 1 tablet (10 mg total) by mouth daily., Disp: 90 tablet, Rfl: 1  No Known Allergies  I personally reviewed active problem list, medication list, allergies, notes from last encounter, lab results with the patient/caregiver today.   ROS  Constitutional: Negative for fever or weight change.  Respiratory: Negative for cough and shortness of breath.   Cardiovascular: Negative for  chest pain or palpitations.  Gastrointestinal: Negative for abdominal pain, no bowel changes.  Musculoskeletal: Negative for gait problem or joint swelling.  Skin: Negative for rash.  Neurological: Negative for dizziness or headache.  No other specific complaints in a complete review of systems (except as listed in HPI above).  Objective  Virtual encounter, vitals not obtained.  There is no height or weight on file to calculate BMI.  Physical Exam  Constitutional: Patient appears well-developed and well-nourished. No distress.  HENT: Head: Normocephalic and atraumatic.  Neck: Normal range of motion. Pulmonary/Chest: Effort normal. No respiratory distress. Speaking in complete sentences Neurological: Pt is alert and oriented to person, place, and time. Coordination, speech are normal.  Psychiatric: Patient has a normal mood and affect. behavior  is normal. Judgment and thought content normal.  No results found for this or any previous visit (from the past 72 hour(s)).  PHQ2/9: Depression screen [facility] 2/9 08/30/2018 05/30/2018 04/07/2018 02/28/2018 02/07/2018  Decreased Interest 1 0 0 0 0  Down, Depressed, Hopeless 0 0 0 0 0  PHQ - 2 Score 1 0 0 0 0  Altered sleeping 0 0 0 - 0  Tired, decreased energy 1 0 0 - 0  Change in appetite 0 0 0 - 0  Feeling bad or failure about yourself  0 0 0 - 0  Trouble concentrating 0 0 0 - 0  Moving slowly or fidgety/restless 0 0 0 - -  Suicidal thoughts 0 0 0 - 0  PHQ-9 Score 2 0 0 - 0  Difficult doing work/chores Not difficult at all Not difficult at all Not difficult at all - Not difficult at all   PHQ-2/9 Result is negative.    Fall Risk: Fall Risk  08/30/2018 05/30/2018 04/07/2018 04/04/2018 03/28/2018  Falls in the past year? 0 0 0 0 (No Data)  Comment - - - - no falls since last visit  Number falls in past yr: 0 0 0 - -  Injury with Fall? 0 0 0 - -  Follow up - Falls evaluation completed Falls evaluation completed - -    Assessment & Plan   1. OSA (obstructive sleep apnea) - Using CPAP nightly.  2. Class 3 severe obesity due to excess calories with serious comorbidity and body mass index (BMI) of 40.0 to 44.9 in adult [facility]) - Discussed importance of 150 minutes of physical activity weekly, eat two servings of fish weekly, eat one serving of tree nuts ( cashews, pistachios, pecans, almonds.Marland Kitchen) every other day, eat 6 servings of fruit/vegetables daily and drink plenty of water and avoid sweet beverages.   3. Tinnitus, unspecified laterality - Stable, continue with ENT  4. Vertigo - Stable, continue with ENT  5. Diet-controlled diabetes mellitus (HCC) - COMPLETE METABOLIC PANEL WITH GFR - Hemoglobin A1c - glucose blood test strip; Use as instructed  Dispense: 100 each; Refill: 12  6. Hyperlipidemia associated with type 2 diabetes mellitus ([place]) - Lipid panel  7. Tinea pedis of both feet - Seeing [facility]   I discussed the assessment and treatment plan with the patient. The patient was provided an opportunity to ask questions and all were answered. The patient agreed with the plan and demonstrated an understanding of the instructions.  The patient was advised to call back or seek an in-person evaluation if the symptoms worsen or if the condition fails to improve as anticipated.  I provided 25 minutes of non-face-to-face time during this encounter.

## 2018-09-03 DIAGNOSIS — G4733 Obstructive sleep apnea (adult) (pediatric): Secondary | ICD-10-CM | POA: Diagnosis not present

## 2018-09-06 ENCOUNTER — Other Ambulatory Visit: Payer: Self-pay

## 2018-09-06 DIAGNOSIS — E1169 Type 2 diabetes mellitus with other specified complication: Secondary | ICD-10-CM

## 2018-09-06 DIAGNOSIS — E119 Type 2 diabetes mellitus without complications: Secondary | ICD-10-CM

## 2018-09-06 DIAGNOSIS — E785 Hyperlipidemia, unspecified: Secondary | ICD-10-CM

## 2018-09-07 LAB — COMPREHENSIVE METABOLIC PANEL
ALT: 25 IU/L (ref 0–44)
AST: 32 IU/L (ref 0–40)
Albumin/Globulin Ratio: 1.4 (ref 1.2–2.2)
Albumin: 4.2 g/dL (ref 3.8–4.9)
Alkaline Phosphatase: 93 IU/L (ref 39–117)
BUN/Creatinine Ratio: 14 (ref 9–20)
BUN: 15 mg/dL (ref 6–24)
Bilirubin Total: 0.5 mg/dL (ref 0.0–1.2)
CO2: 21 mmol/L (ref 20–29)
Calcium: 9 mg/dL (ref 8.7–10.2)
Chloride: 103 mmol/L (ref 96–106)
Creatinine, Ser: 1.07 mg/dL (ref 0.76–1.27)
GFR calc Af Amer: 87 mL/min/{1.73_m2} (ref >59)
GFR calc non Af Amer: 76 mL/min/{1.73_m2} (ref >59)
Globulin, Total: 3.1 g/dL (ref 1.5–4.5)
Glucose: 128 mg/dL — ABNORMAL HIGH (ref 65–99)
Potassium: 4.4 mmol/L (ref 3.5–5.2)
Sodium: 140 mmol/L (ref 134–144)
Total Protein: 7.3 g/dL (ref 6.0–8.5)

## 2018-09-07 LAB — LIPID PANEL
Chol/HDL Ratio: 3.5 ratio (ref 0.0–5.0)
Cholesterol, Total: 143 mg/dL (ref 100–199)
HDL: 41 mg/dL (ref >39)
LDL Calculated: 77 mg/dL (ref 0–99)
Triglycerides: 124 mg/dL (ref 0–149)
VLDL Cholesterol Cal: 25 mg/dL (ref 5–40)

## 2018-09-07 LAB — HGB A1C W/O EAG: Hgb A1c MFr Bld: 6.7 % — ABNORMAL HIGH (ref 4.8–5.6)

## 2018-09-08 ENCOUNTER — Encounter: Payer: Self-pay | Admitting: Family Medicine

## 2018-09-09 ENCOUNTER — Encounter: Payer: Self-pay | Admitting: Podiatry

## 2018-09-09 ENCOUNTER — Ambulatory Visit: Payer: Self-pay | Admitting: Podiatry

## 2018-09-09 ENCOUNTER — Other Ambulatory Visit: Payer: Self-pay

## 2018-09-09 ENCOUNTER — Other Ambulatory Visit: Payer: Self-pay | Admitting: Family Medicine

## 2018-09-09 VITALS — BP 156/90 | HR 59 | Temp 97.5°F

## 2018-09-09 DIAGNOSIS — B351 Tinea unguium: Secondary | ICD-10-CM

## 2018-09-09 DIAGNOSIS — E119 Type 2 diabetes mellitus without complications: Secondary | ICD-10-CM

## 2018-09-09 DIAGNOSIS — B353 Tinea pedis: Secondary | ICD-10-CM

## 2018-09-09 MED ORDER — CONTOUR NEXT ONE KIT: 1.0000 | PACK | Freq: Every day | 0 refills | Status: AC

## 2018-09-09 MED ORDER — TERBINAFINE HCL 250 MG PO TABS: 250.0000 mg | ORAL_TABLET | Freq: Every day | ORAL | 1 refills | Status: DC

## 2018-09-09 MED ORDER — CLOTRIMAZOLE-BETAMETHASONE 1-0.05 % EX CREA: 1.0000 "application " | TOPICAL_CREAM | Freq: Two times a day (BID) | CUTANEOUS | 2 refills | Status: DC

## 2018-09-09 NOTE — Patient Instructions (Signed)
AmLactin Lotion

## 2018-09-13 NOTE — Progress Notes (Signed)
   Subjective: 58 y.o. male presenting today as a new patient with a chief complaint of possible nail fungus of bilateral toes that has been present for the past few years. He also reports dry, cracked, scaly skin to bilateral feet. He was seen by his PCP three months ago for these symptoms. There are no modifying factors noted. He denies itching and burning of the skin. Patient is here for further evaluation and treatment.   Past Medical History:  Diagnosis Date  . Diabetes mellitus without complication ([place])   . Family history of malignant neoplasm of gastrointestinal tract   . Personal history of colonic polyps 2013  . Screening for obesity   . Sleep apnea   . Special screening for malignant neoplasms, colon 2013    Objective: Physical Exam General: The patient is alert and oriented x3 in no acute distress.  Dermatology: Hyperkeratotic, discolored, thickened, onychodystrophy of nails 1-5 of bilateral feet. Hyperkeratosis noted to the bilateral feet. Skin is warm, dry and supple bilateral lower extremities. Negative for open lesions or macerations.  Vascular: Palpable pedal pulses bilaterally. No edema or erythema noted. Capillary refill within normal limits.  Neurological: Epicritic and protective threshold grossly intact bilaterally.   Musculoskeletal Exam: Range of motion within normal limits to all pedal and ankle joints bilateral. Muscle strength 5/5 in all groups bilateral.   Assessment: #1 Onychomycosis nails 1-5 bilateral  #2 Tinea pedis bilateral  #3 T2DM - controlled   Plan of Care:  #1 Patient was evaluated. #2 Prescription for Lotrisone cream provided to patient.  #3 Prescription for Lamisil 250 mg #28 provided to patient.  #4 Patient not concerned about the onychomycosis at this time.  #5 Recommended good foot hygiene.  #6 Return to clinic annually.    Edrick Kins, DPM [facility]  Dr. Edrick Kins, [place]                                         [place], [place] [address]                Office [phone]  Fax [phone]

## 2018-10-03 DIAGNOSIS — G4733 Obstructive sleep apnea (adult) (pediatric): Secondary | ICD-10-CM | POA: Diagnosis not present

## 2018-11-03 DIAGNOSIS — G4733 Obstructive sleep apnea (adult) (pediatric): Secondary | ICD-10-CM | POA: Diagnosis not present

## 2018-12-15 ENCOUNTER — Other Ambulatory Visit: Payer: Self-pay

## 2018-12-15 ENCOUNTER — Ambulatory Visit: Payer: BC Managed Care – PPO

## 2018-12-15 DIAGNOSIS — Z23 Encounter for immunization: Secondary | ICD-10-CM

## 2018-12-16 ENCOUNTER — Ambulatory Visit (INDEPENDENT_AMBULATORY_CARE_PROVIDER_SITE_OTHER): Payer: BC Managed Care – PPO | Admitting: Family Medicine

## 2018-12-16 ENCOUNTER — Encounter: Payer: Self-pay | Admitting: Family Medicine

## 2018-12-16 ENCOUNTER — Other Ambulatory Visit: Payer: Self-pay

## 2018-12-16 DIAGNOSIS — K5900 Constipation, unspecified: Secondary | ICD-10-CM

## 2018-12-16 DIAGNOSIS — R6882 Decreased libido: Secondary | ICD-10-CM

## 2018-12-16 DIAGNOSIS — H9319 Tinnitus, unspecified ear: Secondary | ICD-10-CM

## 2018-12-16 DIAGNOSIS — B353 Tinea pedis: Secondary | ICD-10-CM | POA: Insufficient documentation

## 2018-12-16 DIAGNOSIS — R42 Dizziness and giddiness: Secondary | ICD-10-CM

## 2018-12-16 DIAGNOSIS — E119 Type 2 diabetes mellitus without complications: Secondary | ICD-10-CM | POA: Diagnosis not present

## 2018-12-16 DIAGNOSIS — E785 Hyperlipidemia, unspecified: Secondary | ICD-10-CM

## 2018-12-16 DIAGNOSIS — G4733 Obstructive sleep apnea (adult) (pediatric): Secondary | ICD-10-CM

## 2018-12-16 DIAGNOSIS — Z6841 Body Mass Index (BMI) 40.0 and over, adult: Secondary | ICD-10-CM

## 2018-12-16 DIAGNOSIS — E1169 Type 2 diabetes mellitus with other specified complication: Secondary | ICD-10-CM

## 2018-12-16 MED ORDER — ROSUVASTATIN CALCIUM 10 MG PO TABS: 10.0000 mg | ORAL_TABLET | Freq: Every day | ORAL | 3 refills | Status: DC

## 2018-12-16 MED ORDER — POLYETHYLENE GLYCOL 3350 17 GM/SCOOP PO POWD: ORAL | 1 refills | Status: DC

## 2018-12-16 NOTE — Progress Notes (Signed)
Name: John York   MRN: [id number]    DOB: Aug 04, 1960   Date:12/16/2018       Progress Note  Subjective  Chief Complaint  Chief Complaint  Patient presents with  . Follow-up    3-4 Month Follow Up    I connected with  John York on 12/16/18 at  2:00 PM EDT by telephone and verified that I am speaking with the correct person using two identifiers.  I discussed the limitations, risks, security and privacy concerns of performing an evaluation and management service by telephone and the availability of in person appointments. Staff also discussed with the patient that there may be a patient responsible charge related to this service. Patient Location: Home Provider Location: Office Additional Individuals present: None  HPI  OSA: Denies daytime sleepiness; does snore loudly per family member's reports. He is also obese.He did see Dr. Mortimer Fries after his last follow up and now has CPAP that he uses nightly. It takes a while to go to sleep - drinking chamomile tea prior to bedtime to help.  Sleep hygiene discussed in detail.  Obesity:Has been doing the elliptical and some free weights; no knee pain.He eats a balanced diet, trying to limit portions more. Stable at this time.  Constipation: He notes constipation for about a week now - has had BM's but they are hard and infrequent.  Has tried metamucil and this has not helped.  He is still passing gas regularly, no abdominal tenderness or distension.   Tinnitus: Has been ongoingbut only notices at night when it's quiet; seeing [facility].  No dizziness with the tinnitus, but does have history of vertigo with no recent attacks. Stable today.  Diabetes mellitus type 2 - Has always been diet controlled Checking sugars?  yes  How often? A few times a week Range (low to high) over last two weeks:  125 fasting in the morning. Checking feet every day/night?  yes Last eye exam:  08/22/2018 Denies: Polyuria, polydipsia,  polyphagia, vision changes, or neuropathy. Most recent A1C:  Lab Results  Component Value Date   HGBA1C 6.7 (H) 09/06/2018    We will recheck today. Last CMP Results : is due for repeat today Urine Micro UTD? Yes Current Medication Management: Diabetic Medications: none ACEI/ARB: Not indicated Statin: Yes Aspirin therapy: Not taking crestor - ran out about a month; we will provide refill today.  HLD:Has been out of crestor for about a month - will send in refill today.  No chest pain, shortness of breath, or myalgias.  Tinea Pedis: Seeing Dr. Amalia Hailey with podiatry and is seeing some good effects with toenail clearing.  Completed terbinafine PO course. We will recheck LFT's today.  Low Libido: He notes low libido, having some additional fat development in the arms.  Denies fatigue.  He is concerned about low testosterone levels - we will check this and thyroid today.   Patient Active Problem List   Diagnosis Date Noted  . Seasonal allergic rhinitis 05/30/2018  . Diet-controlled diabetes mellitus ([place]) 02/08/2018  . Hyperlipidemia associated with type 2 diabetes mellitus ([place]) 02/08/2018  . Vertigo 02/07/2018  . OSA (obstructive sleep apnea) 02/07/2018  . Tinnitus 06/16/2017  . Obesity 09/25/2014  . FHx: migraine headaches 09/25/2014  . Other specified joint disorders, unspecified shoulder 04/16/2014  . 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 Family history of malignant neoplasm of gastrointestinal tract   . Personal history of colonic polyps     Past Surgical History:  Procedure Laterality  Date  . COLONOSCOPY  02/03/2012   Dr. Jamal Collin  . COLONOSCOPY W/ POLYPECTOMY  2008   87m size sessile non bleeding polyp was found in the sigmoid colon  . COLONOSCOPY WITH PROPOFOL N/A 02/24/2017   Procedure: COLONOSCOPY WITH PROPOFOL;  Surgeon: SChristene Lye MD;  Location: [facility] ENDOSCOPY;  Service: Endoscopy;  Laterality: N/A;    Family History  Problem Relation Age of Onset  .  Cancer Sister        breast  . Colon cancer Brother   . Diabetes Father     Social History   Socioeconomic History  . Marital status: Single    Spouse name: Not on file  . Number of children: 2  . Years of education: Not on file  . Highest education level: Not on file  Occupational History    Comment: Director of [employer] . Financial resource strain: Not hard at all  . Food insecurity    Worry: Never true    Inability: Never true  . Transportation needs    Medical: No    Non-medical: No  Tobacco Use  . Smoking status: Never Smoker  . Smokeless tobacco: Never Used  Substance and Sexual Activity  . Alcohol use: No    Alcohol/week: 0.0 standard drinks  . Drug use: No  . Sexual activity: Not Currently  Lifestyle  . Physical activity    Days per week: 4 days    Minutes per session: 30 min  . Stress: Only a little  Relationships  . Social connections    Talks on phone: More than three times a week    Gets together: More than three times a week    Attends religious service: More than 4 times per year    Active member of club or organization: Yes    Attends meetings of clubs or organizations: More than 4 times per year    Relationship status: Separated  . Intimate partner violence    Fear of current or ex partner: No    Emotionally abused: No    Physically abused: No    Forced sexual activity: No  Other Topics Concern  . Not on file  Social History Narrative  . Not on file     Current Outpatient Medications:  .  Blood Glucose Monitoring Suppl (CONTOUR NEXT ONE) KIT, 1 each by Does not apply route daily. Please check blood sugar once daily.  May check 1 additional time per day as needed., Disp: 1 kit, Rfl: 0 .  cholecalciferol (VITAMIN D) 1000 units tablet, Take 1,000 Units daily by mouth., Disp: , Rfl:  .  clotrimazole-betamethasone (LOTRISONE) cream, Apply 1 application topically 2 (two) times daily., Disp: 60 g, Rfl: 2 .  fexofenadine  (ALLEGRA ALLERGY) 180 MG tablet, Take 1 tablet (180 mg total) by mouth daily., Disp: 90 tablet, Rfl: 1 .  fluticasone (FLONASE) 50 MCG/ACT nasal spray, Place 2 sprays into both nostrils daily., Disp: 16 g, Rfl: 6 .  glucose blood test strip, Use as instructed, Disp: 100 each, Rfl: 12 .  rosuvastatin (CRESTOR) 10 MG tablet, Take 1 tablet (10 mg total) by mouth daily., Disp: 90 tablet, Rfl: 1 .  terbinafine (LAMISIL) 250 MG tablet, Take 1 tablet (250 mg total) by mouth daily., Disp: 28 tablet, Rfl: 1  No Known Allergies  I personally reviewed active problem list, medication list, allergies, health maintenance, notes from last encounter, lab results with the patient/caregiver today.   ROS  Constitutional:  Negative for fever or weight change.  Respiratory: Negative for cough and shortness of breath.   Cardiovascular: Negative for chest pain or palpitations.  Gastrointestinal: Negative for abdominal pain, no bowel changes.  Musculoskeletal: Negative for gait problem or joint swelling.  Skin: Negative for rash.  Neurological: Negative for dizziness or headache.  No other specific complaints in a complete review of systems (except as listed in HPI above).  Objective  Virtual encounter, vitals not obtained.  There is no height or weight on file to calculate BMI.  Physical Exam  Pulmonary/Chest: Effort normal. No respiratory distress. Speaking in complete sentences Neurological: Pt is alert and oriented to person, place, and time. Speech is normal Psychiatric: Patient has a normal mood and affect. behavior is normal. Judgment and thought content normal.   No results found for this or any previous visit (from the past 72 hour(s)).  PHQ2/9: Depression screen [facility] 2/9 12/16/2018 08/30/2018 05/30/2018 04/07/2018 02/28/2018  Decreased Interest 0 1 0 0 0  Down, Depressed, Hopeless 0 0 0 0 0  PHQ - 2 Score 0 1 0 0 0  Altered sleeping 0 0 0 0 -  Tired, decreased energy 0 1 0 0 -  Change in  appetite 0 0 0 0 -  Feeling bad or failure about yourself  0 0 0 0 -  Trouble concentrating 0 0 0 0 -  Moving slowly or fidgety/restless 0 0 0 0 -  Suicidal thoughts 0 0 0 0 -  PHQ-9 Score 0 2 0 0 -  Difficult doing work/chores - Not difficult at all Not difficult at all Not difficult at all -   PHQ-2/9 Result is negative.    Fall Risk: Fall Risk  12/16/2018 08/30/2018 05/30/2018 04/07/2018 04/04/2018  Falls in the past year? 0 0 0 0 0  Comment - - - - -  Number falls in past yr: 0 0 0 0 -  Injury with Fall? 0 0 0 0 -  Follow up - - Falls evaluation completed Falls evaluation completed -   Assessment & Plan 1. OSA (obstructive sleep apnea) - Compliant with CPAP  2. Class 3 severe obesity due to excess calories with serious comorbidity and body mass index (BMI) of 40.0 to 44.9 in adult [facility]) - Discussed importance of 150 minutes of physical activity weekly, eat two servings of fish weekly, eat one serving of tree nuts ( cashews, pistachios, pecans, almonds.Marland Kitchen) every other day, eat 6 servings of fruit/vegetables daily and drink plenty of water and avoid sweet beverages.  - COMPLETE METABOLIC PANEL WITH GFR - Hemoglobin A1c - TSH  3. Constipation, unspecified constipation type - polyethylene glycol powder (MIRALAX) 17 GM/SCOOP powder; Take twice daily until BM's become regular, then once daily.  Dispense: 255 g; Refill: 1 - Red flags for signs and symptoms of blockage reviewed  4. Diet-controlled diabetes mellitus ([facility]) - COMPLETE METABOLIC PANEL WITH GFR - Hemoglobin A1c - rosuvastatin (CRESTOR) 10 MG tablet; Take 1 tablet (10 mg total) by mouth daily.  Dispense: 90 tablet; Refill: 3  5. Hyperlipidemia associated with type 2 diabetes mellitus ([place]) - Restart crestor - rosuvastatin (CRESTOR) 10 MG tablet; Take 1 tablet (10 mg total) by mouth daily.  Dispense: 90 tablet; Refill: 3  6. Vertigo 7. Tinnitus, unspecified laterality Needs follow up with ENT  8. Tinea pedis of both feet  - Clearing s/p PO lamisil; CMP to recheck LFT's  9. Low libido - TSH - Testosterone Total,Free,Bio, Males   I discussed the  assessment and treatment plan with the patient. The patient was provided an opportunity to ask questions and all were answered. The patient agreed with the plan and demonstrated an understanding of the instructions.   The patient was advised to call back or seek an in-person evaluation if the symptoms worsen or if the condition fails to improve as anticipated.  I provided 22 minutes of non-face-to-face time during this encounter.  Hubbard Hartshorn, FNP

## 2019-01-09 DIAGNOSIS — H9042 Sensorineural hearing loss, unilateral, left ear, with unrestricted hearing on the contralateral side: Secondary | ICD-10-CM | POA: Diagnosis not present

## 2019-01-09 DIAGNOSIS — H9312 Tinnitus, left ear: Secondary | ICD-10-CM | POA: Diagnosis not present

## 2019-01-09 DIAGNOSIS — R439 Unspecified disturbances of smell and taste: Secondary | ICD-10-CM | POA: Diagnosis not present

## 2019-01-09 DIAGNOSIS — J301 Allergic rhinitis due to pollen: Secondary | ICD-10-CM | POA: Diagnosis not present

## 2019-01-28 ENCOUNTER — Encounter: Payer: Self-pay | Admitting: Family Medicine

## 2019-02-03 ENCOUNTER — Telehealth: Payer: Self-pay | Admitting: Emergency Medicine

## 2019-02-03 NOTE — Telephone Encounter (Signed)
Please order labs to fax over to [facility]

## 2019-02-03 NOTE — Telephone Encounter (Signed)
Labs are already ordered.  Just need to print and fax.

## 2019-02-03 NOTE — Telephone Encounter (Signed)
Copied from [place] [id number]. Topic: General - Inquiry >> Feb 02, 2019  9:20 AM Scherrie Gerlach wrote: Reason for CRM: pt has mychart message that Raquel Sarna put in lab orders for him and he can pick up. But he is asking if this can be faxed instead [phone] (health center on campus at [facility])

## 2019-02-06 NOTE — Telephone Encounter (Signed)
Labs faxed

## 2019-02-08 ENCOUNTER — Other Ambulatory Visit: Payer: BC Managed Care – PPO

## 2019-02-08 ENCOUNTER — Other Ambulatory Visit: Payer: Self-pay

## 2019-02-08 DIAGNOSIS — Z6841 Body Mass Index (BMI) 40.0 and over, adult: Secondary | ICD-10-CM

## 2019-02-08 DIAGNOSIS — E119 Type 2 diabetes mellitus without complications: Secondary | ICD-10-CM

## 2019-02-08 DIAGNOSIS — R6882 Decreased libido: Secondary | ICD-10-CM

## 2019-02-09 LAB — HEMOGLOBIN A1C
Est. average glucose Bld gHb Est-mCnc: 131 mg/dL
Hgb A1c MFr Bld: 6.2 % — ABNORMAL HIGH (ref 4.8–5.6)

## 2019-02-09 LAB — COMPREHENSIVE METABOLIC PANEL
ALT: 32 IU/L (ref 0–44)
AST: 37 IU/L (ref 0–40)
Albumin/Globulin Ratio: 1.4 (ref 1.2–2.2)
Albumin: 4.2 g/dL (ref 3.8–4.9)
Alkaline Phosphatase: 111 IU/L (ref 39–117)
BUN/Creatinine Ratio: 15 (ref 9–20)
BUN: 17 mg/dL (ref 6–24)
Bilirubin Total: 0.4 mg/dL (ref 0.0–1.2)
CO2: 24 mmol/L (ref 20–29)
Calcium: 9.3 mg/dL (ref 8.7–10.2)
Chloride: 105 mmol/L (ref 96–106)
Creatinine, Ser: 1.16 mg/dL (ref 0.76–1.27)
GFR calc Af Amer: 79 mL/min/{1.73_m2} (ref >59)
GFR calc non Af Amer: 69 mL/min/{1.73_m2} (ref >59)
Globulin, Total: 3.1 g/dL (ref 1.5–4.5)
Glucose: 94 mg/dL (ref 65–99)
Potassium: 4.5 mmol/L (ref 3.5–5.2)
Sodium: 141 mmol/L (ref 134–144)
Total Protein: 7.3 g/dL (ref 6.0–8.5)

## 2019-02-09 LAB — TSH: TSH: 1.17 u[IU]/mL (ref 0.450–4.500)

## 2019-02-10 DIAGNOSIS — G4733 Obstructive sleep apnea (adult) (pediatric): Secondary | ICD-10-CM | POA: Diagnosis not present

## 2019-04-26 ENCOUNTER — Encounter: Payer: Self-pay | Admitting: Family Medicine

## 2019-05-17 DIAGNOSIS — Z23 Encounter for immunization: Secondary | ICD-10-CM | POA: Diagnosis not present

## 2019-06-19 DIAGNOSIS — Z23 Encounter for immunization: Secondary | ICD-10-CM | POA: Diagnosis not present

## 2019-06-26 ENCOUNTER — Encounter: Payer: Self-pay | Admitting: Family Medicine

## 2019-07-05 ENCOUNTER — Other Ambulatory Visit: Payer: BC Managed Care – PPO

## 2019-07-06 ENCOUNTER — Other Ambulatory Visit: Payer: BC Managed Care – PPO

## 2019-07-10 DIAGNOSIS — H9042 Sensorineural hearing loss, unilateral, left ear, with unrestricted hearing on the contralateral side: Secondary | ICD-10-CM | POA: Diagnosis not present

## 2019-07-13 ENCOUNTER — Other Ambulatory Visit: Payer: Self-pay

## 2019-07-13 DIAGNOSIS — E119 Type 2 diabetes mellitus without complications: Secondary | ICD-10-CM

## 2019-07-13 DIAGNOSIS — Z6841 Body Mass Index (BMI) 40.0 and over, adult: Secondary | ICD-10-CM

## 2019-07-14 LAB — HGB A1C W/O EAG: Hgb A1c MFr Bld: 6.8 % — ABNORMAL HIGH (ref 4.8–5.6)

## 2019-07-21 DIAGNOSIS — G4733 Obstructive sleep apnea (adult) (pediatric): Secondary | ICD-10-CM | POA: Diagnosis not present

## 2019-07-26 NOTE — Progress Notes (Signed)
Patient ID: John York, male    DOB: [birth year], 59 y.o.   MRN: [id number]  PCP: Towanda Malkin, MD  Chief Complaint  Patient presents with  . Follow-up  . Sleep Apnea  . Diabetes  . Obesity    Subjective:   John York is a 59 y.o. male, presents to clinic with CC of the following:  Chief Complaint  Patient presents with  . Follow-up  . Sleep Apnea  . Diabetes  . Obesity    HPI:  Patient is a 59 year old male patient of Raelyn Ensign Last follow-up visit with Raquel Sarna was in October 2020, a telephone visit Follows up today after having some fairly recent labs done, with an elevated A1c a concern noted.  OSA: He did see Dr. Mortimer Fries and now has CPAP that he uses nightly. Notes is helpful, although has not noted a dramatic change as he notes some others have per report  Obesity - Morbid:Has been doing the elliptical and some free weights; not in past week due to gym assessability, is trying to stay regular with exercise in recent past He eats a balanced diet, trying to limit portions more and yet has not had luck with losing weight. Wt Readings from Last 3 Encounters:  07/27/19 (!) 304 lb (137.9 kg)  05/30/18 (!) 303 lb 11.2 oz (137.8 kg)  04/07/18 (!) 304 lb 3.2 oz (138 kg)    Diabetes mellitustype 2- Has always been diet controlled Checking sugars?yes How often?A few times a week Range (low to high) over last two weeks:130-150  fasting in the morning.  No really low reading Last eye exam:Next appt in June 2021, sees yearly Denies: Increased thirst, increased urination, vision changes, or numbness and tingling in the extremities Most recent A1C: Lab Results  Component Value Date   HGBA1C 6.8 (H) 07/13/2019   HGBA1C 6.2 (H) 02/08/2019   HGBA1C 6.7 (H) 09/06/2018   Lab Results  Component Value Date   MICROALBUR 1.0 05/30/2018   LDLCALC 77 09/06/2018   CREATININE 1.16 02/08/2019   Urine Micro UTD - neg on last check  05/2018 Diabetic Medications: none ACEI/ARB:Not presently Statin:Yes, crestor - 10 mg   HLD:On a statin Lab Results  Component Value Date   CHOL 143 09/06/2018   HDL 41 09/06/2018   LDLCALC 77 09/06/2018   TRIG 124 09/06/2018   CHOLHDL 3.5 09/06/2018   No chest pain, shortness of breath, palpitations, or myalgias.  Low Libido/prostate cancer screening: He noted low libido previous, and a testosterone was ordered, although I do not see a result I do not think it was done. Did discuss today some concerns with testosterone supplementation if it is felt needed, and indications for it, mainly due to sexual concern for low testosterone. Offered to repeat the test again today, versus adding it to the labs recheck again in approximately 3 months time on follow-up, and agreed to check it again with his follow-up lab tests in about 3 months. denies marked hesitancy, frequency, urgency symptoms, not up overnight to urinate Has been following PSAs to help with prostate cancer screening standpoint Lab Results  Component Value Date   PSA 0.6 02/07/2018   Patient's complete metabolic panel and TSH were normal when checked in November, 2020  Patient Active Problem List   Diagnosis Date Noted  . Constipation 12/16/2018  . Tinea pedis of both feet 12/16/2018  . Seasonal allergic rhinitis 05/30/2018  . Diet-controlled diabetes mellitus ([place]) 02/08/2018  .  Hyperlipidemia associated with type 2 diabetes mellitus ([place]) 02/08/2018  . Vertigo 02/07/2018  . OSA (obstructive sleep apnea) 02/07/2018  . Tinnitus 06/16/2017  . Class 3 severe obesity due to excess calories with serious comorbidity and body mass index (BMI) of 40.0 to 44.9 in adult ([place]) 09/25/2014  . FHx: migraine headaches 09/25/2014  . Other specified joint disorders, unspecified shoulder 04/16/2014  . 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 Family history of malignant neoplasm of gastrointestinal tract   . Personal history of colonic  polyps       Current Outpatient Medications:  .  Blood Glucose Monitoring Suppl (CONTOUR NEXT ONE) KIT, 1 each by Does not apply route daily. Please check blood sugar once daily.  May check 1 additional time per day as needed., Disp: 1 kit, Rfl: 0 .  cholecalciferol (VITAMIN D) 1000 units tablet, Take 1,000 Units daily by mouth., Disp: , Rfl:  .  fexofenadine (ALLEGRA ALLERGY) 180 MG tablet, Take 1 tablet (180 mg total) by mouth daily., Disp: 90 tablet, Rfl: 1 .  fluticasone (FLONASE) 50 MCG/ACT nasal spray, Place 2 sprays into both nostrils daily., Disp: 16 g, Rfl: 6 .  glucose blood test strip, Use as instructed, Disp: 100 each, Rfl: 12 .  rosuvastatin (CRESTOR) 10 MG tablet, Take 1 tablet (10 mg total) by mouth daily., Disp: 90 tablet, Rfl: 3   No Known Allergies   Past Surgical History:  Procedure Laterality Date  . COLONOSCOPY  02/03/2012   Dr. Jamal Collin  . COLONOSCOPY W/ POLYPECTOMY  2008   6m size sessile non bleeding polyp was found in the sigmoid colon  . COLONOSCOPY WITH PROPOFOL N/A 02/24/2017   Procedure: COLONOSCOPY WITH PROPOFOL;  Surgeon: SChristene Lye MD;  Location: [facility] ENDOSCOPY;  Service: Endoscopy;  Laterality: N/A;     Family History  Problem Relation Age of Onset  . Cancer Sister        breast  . Colon cancer Brother   . Diabetes Father      Social History   Tobacco Use  . Smoking status: Never Smoker  . Smokeless tobacco: Never Used  Substance Use Topics  . Alcohol use: No    Alcohol/week: 0.0 standard drinks    With staff assistance, above reviewed with the patient today.  ROS: As per HPI, noted some mild constipation at times, not marked, otherwise no specific complaints on a limited and focused system review   No results found for this or any previous visit (from the past 72 hour(s)).   PHQ2/9: [facility] 07/27/2019 12/16/2018 08/30/2018 05/30/2018 04/07/2018  Decreased Interest 0 0 1 0 0  Down, Depressed, Hopeless 0  0 0 0 0  PHQ - 2 Score 0 0 1 0 0  Altered sleeping 0 0 0 0 0  Tired, decreased energy 0 0 1 0 0  Change in appetite 0 0 0 0 0  Feeling bad or failure about yourself  0 0 0 0 0  Trouble concentrating 0 0 0 0 0  Moving slowly or fidgety/restless 0 0 0 0 0  Suicidal thoughts 0 0 0 0 0  PHQ-9 Score 0 0 2 0 0  Difficult doing work/chores Not difficult at all - Not difficult at all Not difficult at all Not difficult at all   PHQ-2/9 Result is neg  Fall Risk: Fall Risk  07/27/2019 12/16/2018 08/30/2018 05/30/2018 04/07/2018  Falls in the past year? 0 0 0 0 0  Comment - - - - -  Number falls in past yr: 0 0 0 0 0  Injury with Fall? 0 0 0 0 0  Follow up - - - Falls evaluation completed Falls evaluation completed      Objective:   Vitals:   07/27/19 1104  BP: 134/84  Pulse: 71  Resp: 16  Temp: (!) 97.1 F (36.2 C)  TempSrc: Temporal  SpO2: 97%  Weight: (!) 304 lb (137.9 kg)  Height: 5' 9"  (1.753 m)    Body mass index is 44.89 kg/m.  Physical Exam   NAD, masked, very pleasant, obese HEENT - [place]/AT, sclera anicteric, PERRL, EOMI, conj - non-inj'ed, pharynx clear Neck - supple, no adenopathy, no TM, carotids 2+ and = without bruits bilat Car - RRR without m/g/r Pulm- RR and effort normal at rest, CTA without wheeze or rales Abd - soft, obese, NT, ND, BS+,  no masses, no obvious HSM Back - no CVA tenderness Ext - no LE edema,  Diabetic foot exam: No skin breakdown, ulcers Normal DP pulses Normal sensation to light touch  Monofilament testing within normal limits Neuro/psychiatric - affect was not flat, appropriate with conversation  Alert and oriented  Grossly non-focal - sensation intact to LT in distal extremities, DTRs 2+ and equal in the patella  Speech  normal   Results for orders placed or performed in visit on 07/13/19  Hgb A1c w/o eAG  Result Value Ref Range   Hgb A1c MFr Bld 6.8 (H) 4.8 - 5.6 %       Assessment & Plan:   1. Type 2 diabetes mellitus without  complication, without long-term current use of insulin ([place]) Discussed with patient that his diagnosis is diabetes presently, and do feel medications are needed presently to help better control his blood sugars.  Discussed the importance of this with respect to protecting his kidneys, heart and overall health in the future.  He was understanding of this. Discussed medications that often are started, with Metformin typically the first added, although did discuss other options including liraglutide or Ozempic, as they not only help control blood sugars, but can help with weight loss which would be helpful.  I did note that they were administered by injection, and he was not anxious to have an injectable medicine presently.  He was okay starting the Metformin, and will start with 500 mg with breakfast, although in 1 to 2 weeks time progressed to 500 mg twice daily as I do think he will need at least this high of a dose, and he will be checking his blood sugars at home, and if remaining above the desired range fasting (about 120), he will increase the dose as recommended today.  Did discuss the potential side effects, mostly gastrointestinal related with Metformin use. Continuing with dietary modifications and trying to stay active with increased physical activity was also emphasized as important moving forward as is weight management, and encouraged trying to lose weight in the future to help.  Offered a nutritionist to help with input, and he states he is already seen 1 in the past Continue with yearly eye exams, also keeping a close watch on his feet recommended today He is already on a statin and the importance of that continuing was emphasized as well. - metFORMIN (GLUCOPHAGE) 500 MG tablet; Take 1 tablet (500 mg total) by mouth daily with breakfast. Increase to twice daily in 1-2 weeks if no improvements in blood sugars on checks.  Dispense: 90 tablet; Refill: 5  2. Morbid obesity ([place])  As noted above,  weight loss would be beneficial, and discussed the medicine that could help with that in addition to blood sugar control.  It is an injectable medicine which limited his wanting to start today. Continuing with attempts at weight loss through exercise and dietary modification  3. OSA (obstructive sleep apnea) Continue the CPAP, as he does think it has been helpful  4. Hyperlipidemia associated with type 2 diabetes mellitus ([place]) Continue the statin The goal with diabetes is an LDL less than 70  5. Low libido I did note that the testosterone level did not seem to be run from the prior order and will recheck again we will recheck labs on his follow-up visit in approximately 3 months.  Did educate on this topic today at length.  We will follow-up in approximately 3 months time, sooner as needed        Towanda Malkin, MD 07/27/19 11:15 AM

## 2019-07-27 ENCOUNTER — Ambulatory Visit: Payer: BC Managed Care – PPO | Admitting: Internal Medicine

## 2019-07-27 ENCOUNTER — Other Ambulatory Visit: Payer: Self-pay

## 2019-07-27 ENCOUNTER — Encounter: Payer: Self-pay | Admitting: Internal Medicine

## 2019-07-27 VITALS — BP 134/84 | HR 71 | Temp 97.1°F | Resp 16 | Ht 69.0 in | Wt 304.0 lb

## 2019-07-27 DIAGNOSIS — G4733 Obstructive sleep apnea (adult) (pediatric): Secondary | ICD-10-CM

## 2019-07-27 DIAGNOSIS — E119 Type 2 diabetes mellitus without complications: Secondary | ICD-10-CM | POA: Diagnosis not present

## 2019-07-27 DIAGNOSIS — E1169 Type 2 diabetes mellitus with other specified complication: Secondary | ICD-10-CM | POA: Diagnosis not present

## 2019-07-27 DIAGNOSIS — R6882 Decreased libido: Secondary | ICD-10-CM

## 2019-07-27 DIAGNOSIS — E785 Hyperlipidemia, unspecified: Secondary | ICD-10-CM

## 2019-07-27 MED ORDER — METFORMIN HCL 500 MG PO TABS: 500.0000 mg | ORAL_TABLET | Freq: Every day | ORAL | 5 refills | Status: DC

## 2019-07-27 NOTE — Patient Instructions (Signed)
Type 2 Diabetes Mellitus, Self Care, Adult When you have type 2 diabetes (type 2 diabetes mellitus), you must make sure your blood sugar (glucose) stays in a healthy range. You can do this with:  Nutrition.  Exercise.  Lifestyle changes.  Medicines or insulin, if needed.  Support from your doctors and others. How to stay aware of blood sugar   Check your blood sugar level every day, as often as told.  Have your A1c (hemoglobin A1c) level checked two or more times a year. Have it checked more often if your doctor tells you to. Your doctor will set personal treatment goals for you. Generally, you should have these blood sugar levels:  Before meals (preprandial): 80-130 mg/dL (4.4-7.2 mmol/L).  After meals (postprandial): below 180 mg/dL (10 mmol/L).  A1c level: less than 7%. How to manage high and low blood sugar Signs of high blood sugar High blood sugar is called hyperglycemia. Know the signs of high blood sugar. Signs may include:  Feeling: ? Thirsty. ? Hungry. ? Very tired.  Needing to pee (urinate) more than usual.  Blurry vision. Signs of low blood sugar Low blood sugar is called hypoglycemia. This is when blood sugar is at or below 70 mg/dL (3.9 mmol/L). Signs may include:  Feeling: ? Hungry. ? Worried or nervous (anxious). ? Sweaty and clammy. ? Confused. ? Dizzy. ? Sleepy. ? Sick to your stomach (nauseous).  Having: ? A fast heartbeat. ? A headache. ? A change in your vision. ? Jerky movements that you cannot control (seizure). ? Tingling or no feeling (numbness) around your mouth, lips, or tongue.  Having trouble with: ? Moving (coordination). ? Sleeping. ? Passing out (fainting). ? Getting upset easily (irritability). Treating low blood sugar To treat low blood sugar, eat or drink something sugary right away. If you can think clearly and swallow safely, follow the 15:15 rule:  Take 15 grams of a fast-acting carb (carbohydrate). Talk with your  doctor about how much you should take.  Some fast-acting carbs are: ? Sugar tablets (glucose pills). Take 3-4 pills. ? 6-8 pieces of hard candy. ? 4-6 oz (120-150 mL) of fruit juice. ? 4-6 oz (120-150 mL) of regular (not diet) soda. ? 1 Tbsp (15 mL) honey or sugar.  Check your blood sugar 15 minutes after you take the carb.  If your blood sugar is still at or below 70 mg/dL (3.9 mmol/L), take 15 grams of a carb again.  If your blood sugar does not go above 70 mg/dL (3.9 mmol/L) after 3 tries, get help right away.  After your blood sugar goes back to normal, eat a meal or a snack within 1 hour. Treating very low blood sugar If your blood sugar is at or below 54 mg/dL (3 mmol/L), you have very low blood sugar (severe hypoglycemia). This is an emergency. Do not wait to see if the symptoms will go away. Get medical help right away. Call your local emergency services (911 in the [place] If you have very low blood sugar and you cannot eat or drink, you may need a glucagon shot (injection). A family member or friend should learn how to check your blood sugar and how to give you a glucagon shot. Ask your doctor if you need to have a glucagon shot kit at home. Follow these instructions at home: Medicine  Take insulin and diabetes medicines as told.  If your doctor says you should take more or less insulin and medicines, do this exactly as told.  Do not run out of insulin or medicines. Having diabetes can raise your risk for other long-term conditions. These include heart disease and kidney disease. Your doctor may prescribe medicines to help you not have these problems. Food   Make healthy food choices. These include: ? Chicken, fish, egg whites, and beans. ? Oats, whole wheat, bulgur, brown rice, quinoa, and millet. ? Fresh fruits and vegetables. ? Low-fat dairy products. ? Nuts, avocado, olive oil, and canola oil.  Meet with a food specialist (dietitian). He or she can help you make an  eating plan that is right for you.  Follow instructions from your doctor about what you cannot eat or drink.  Drink enough fluid to keep your pee (urine) pale yellow.  Keep track of carbs that you eat. Do this by reading food labels and learning food serving sizes.  Follow your sick day plan when you cannot eat or drink normally. Make this plan with your doctor so it is ready to use. Activity  Exercise 3 or more times a week.  Do not go more than 2 days without exercising.  Talk with your doctor before you start a new exercise. Your doctor may need to tell you to change: ? How much insulin or medicines you take. ? How much food you eat. Lifestyle  Do not use any tobacco products. These include cigarettes, chewing tobacco, and e-cigarettes. If you need help quitting, ask your doctor.  Ask your doctor how much alcohol is safe for you.  Learn to deal with stress. If you need help with this, ask your doctor. Body care   Stay up to date with your shots (immunizations).  Have your eyes and feet checked by a doctor as often as told.  Check your skin and feet every day. Check for cuts, bruises, redness, blisters, or sores.  Brush your teeth and gums two times a day. Floss one or more times a day.  Go to the dentist one or more times every 6 months.  Stay at a healthy weight. General instructions  Take over-the-counter and prescription medicines only as told by your doctor.  Share your diabetes care plan with: ? Your work or school. ? People you live with.  Carry a card or wear jewelry that says you have diabetes.  Keep all follow-up visits as told by your doctor. This is important. Questions to ask your doctor  Do I need to meet with a diabetes educator?  Where can I find a support group for people with diabetes? Where to find more information To learn more about diabetes, visit:  American Diabetes Association: www.diabetes.org  American Association of Diabetes  Educators: www.diabeteseducator.org Summary  When you have type 2 diabetes, you must make sure your blood sugar (glucose) stays in a healthy range.  Check your blood sugar every day, as often as told.  Having diabetes can raise your risk for other conditions. Your doctor may prescribe medicines to help you not have these problems.  Keep all follow-up visits as told by your doctor. This is important. This information is not intended to replace advice given to you by your health care provider. Make sure you discuss any questions you have with your health care provider. Document Revised: 08/23/2017 Document Reviewed: 04/05/2015 Elsevier Patient Education  2020 [employer]   Diabetes Mellitus and Nutrition, Adult When you have diabetes (diabetes mellitus), it is very important to have healthy eating habits because your blood sugar (glucose) levels are greatly affected by what you   eat and drink. Eating healthy foods in the appropriate amounts, at about the same times every day, can help you:  Control your blood glucose.  Lower your risk of heart disease.  Improve your blood pressure.  Reach or maintain a healthy weight. Every person with diabetes is different, and each person has different needs for a meal plan. Your health care provider may recommend that you work with a diet and nutrition specialist (dietitian) to make a meal plan that is best for you. Your meal plan may vary depending on factors such as:  The calories you need.  The medicines you take.  Your weight.  Your blood glucose, blood pressure, and cholesterol levels.  Your activity level.  Other health conditions you have, such as heart or kidney disease. How do carbohydrates affect me? Carbohydrates, also called carbs, affect your blood glucose level more than any other type of food. Eating carbs naturally raises the amount of glucose in your blood. Carb counting is a method for keeping track of how many carbs you  eat. Counting carbs is important to keep your blood glucose at a healthy level, especially if you use insulin or take certain oral diabetes medicines. It is important to know how many carbs you can safely have in each meal. This is different for every person. Your dietitian can help you calculate how many carbs you should have at each meal and for each snack. Foods that contain carbs include:  Bread, cereal, rice, pasta, and crackers.  Potatoes and corn.  Peas, beans, and lentils.  Milk and yogurt.  Fruit and juice.  Desserts, such as cakes, cookies, ice cream, and candy. How does alcohol affect me? Alcohol can cause a sudden decrease in blood glucose (hypoglycemia), especially if you use insulin or take certain oral diabetes medicines. Hypoglycemia can be a life-threatening condition. Symptoms of hypoglycemia (sleepiness, dizziness, and confusion) are similar to symptoms of having too much alcohol. If your health care provider says that alcohol is safe for you, follow these guidelines:  Limit alcohol intake to no more than 1 drink per day for nonpregnant women and 2 drinks per day for men. One drink equals 12 oz of beer, 5 oz of wine, or 1 oz of hard liquor.  Do not drink on an empty stomach.  Keep yourself hydrated with water, diet soda, or unsweetened iced tea.  Keep in mind that regular soda, juice, and other mixers may contain a lot of sugar and must be counted as carbs. What are tips for following this plan?  Reading food labels  Start by checking the serving size on the "Nutrition Facts" label of packaged foods and drinks. The amount of calories, carbs, fats, and other nutrients listed on the label is based on one serving of the item. Many items contain more than one serving per package.  Check the total grams (g) of carbs in one serving. You can calculate the number of servings of carbs in one serving by dividing the total carbs by 15. For example, if a food has 30 g of total  carbs, it would be equal to 2 servings of carbs.  Check the number of grams (g) of saturated and trans fats in one serving. Choose foods that have low or no amount of these fats.  Check the number of milligrams (mg) of salt (sodium) in one serving. Most people should limit total sodium intake to less than 2,300 mg per day.  Always check the nutrition information of foods labeled   as "low-fat" or "nonfat". These foods may be higher in added sugar or refined carbs and should be avoided.  Talk to your dietitian to identify your daily goals for nutrients listed on the label. Shopping  Avoid buying canned, premade, or processed foods. These foods tend to be high in fat, sodium, and added sugar.  Shop around the outside edge of the grocery store. This includes fresh fruits and vegetables, bulk grains, fresh meats, and fresh dairy. Cooking  Use low-heat cooking methods, such as baking, instead of high-heat cooking methods like deep frying.  Cook using healthy oils, such as olive, canola, or sunflower oil.  Avoid cooking with butter, cream, or high-fat meats. Meal planning  Eat meals and snacks regularly, preferably at the same times every day. Avoid going long periods of time without eating.  Eat foods high in fiber, such as fresh fruits, vegetables, beans, and whole grains. Talk to your dietitian about how many servings of carbs you can eat at each meal.  Eat 4-6 ounces (oz) of lean protein each day, such as lean meat, chicken, fish, eggs, or tofu. One oz of lean protein is equal to: ? 1 oz of meat, chicken, or fish. ? 1 egg. ?  cup of tofu.  Eat some foods each day that contain healthy fats, such as avocado, nuts, seeds, and fish. Lifestyle  Check your blood glucose regularly.  Exercise regularly as told by your health care provider. This may include: ? 150 minutes of moderate-intensity or vigorous-intensity exercise each week. This could be brisk walking, biking, or water  aerobics. ? Stretching and doing strength exercises, such as yoga or weightlifting, at least 2 times a week.  Take medicines as told by your health care provider.  Do not use any products that contain nicotine or tobacco, such as cigarettes and e-cigarettes. If you need help quitting, ask your health care provider.  Work with a counselor or diabetes educator to identify strategies to manage stress and any emotional and social challenges. Questions to ask a health care provider  Do I need to meet with a diabetes educator?  Do I need to meet with a dietitian?  What number can I call if I have questions?  When are the best times to check my blood glucose? Where to find more information:  American Diabetes Association: diabetes.org  Academy of Nutrition and Dietetics: www.eatright.org  National Institute of Diabetes and Digestive and Kidney Diseases (NIH): www.niddk.nih.gov Summary  A healthy meal plan will help you control your blood glucose and maintain a healthy lifestyle.  Working with a diet and nutrition specialist (dietitian) can help you make a meal plan that is best for you.  Keep in mind that carbohydrates (carbs) and alcohol have immediate effects on your blood glucose levels. It is important to count carbs and to use alcohol carefully. This information is not intended to replace advice given to you by your health care provider. Make sure you discuss any questions you have with your health care provider. Document Revised: 02/12/2017 Document Reviewed: 04/06/2016 Elsevier Patient Education  2020 [employer]   

## 2019-08-24 ENCOUNTER — Encounter: Payer: Self-pay | Admitting: Internal Medicine

## 2019-11-22 DIAGNOSIS — Z23 Encounter for immunization: Secondary | ICD-10-CM | POA: Diagnosis not present

## 2019-12-01 ENCOUNTER — Other Ambulatory Visit: Payer: Self-pay

## 2019-12-01 DIAGNOSIS — E119 Type 2 diabetes mellitus without complications: Secondary | ICD-10-CM

## 2019-12-01 DIAGNOSIS — E1169 Type 2 diabetes mellitus with other specified complication: Secondary | ICD-10-CM

## 2019-12-01 MED ORDER — ROSUVASTATIN CALCIUM 10 MG PO TABS: 10.0000 mg | ORAL_TABLET | Freq: Every day | ORAL | 3 refills | Status: DC

## 2020-02-13 DIAGNOSIS — R059 Cough, unspecified: Secondary | ICD-10-CM | POA: Diagnosis not present

## 2020-02-13 DIAGNOSIS — J019 Acute sinusitis, unspecified: Secondary | ICD-10-CM | POA: Diagnosis not present

## 2020-02-13 DIAGNOSIS — Z03818 Encounter for observation for suspected exposure to other biological agents ruled out: Secondary | ICD-10-CM | POA: Diagnosis not present

## 2020-02-15 DIAGNOSIS — G4733 Obstructive sleep apnea (adult) (pediatric): Secondary | ICD-10-CM | POA: Diagnosis not present

## 2020-02-16 DIAGNOSIS — G4733 Obstructive sleep apnea (adult) (pediatric): Secondary | ICD-10-CM | POA: Diagnosis not present

## 2020-04-02 NOTE — Progress Notes (Signed)
Name: John York   MRN: [id number]    DOB: 09-23-1960   Date:04/04/2020       Progress Note  Subjective  Chief Complaint  Chief Complaint  Patient presents with  . Annual Exam    HPI  Patient is a 60 year old male Last visit with me was 08/14/2019 He was to follow-up 3 months after that visit, and never did.  Did start metformin on that visit. Follows up today for an annual physical.  Last annual physical was 03/2018  OSA: He did see Dr. Mortimer Fries and now has CPAP that he uses nightly. Notes is helpful  Diabetes mellitustype 2- Had always been diet controlled After last visit in May, recommended metformin-500 mg daily, and to increase and he did not increase He does check his sugars at home, have been 120-130's and will continue to monitor Also recommended dietary modifications and diet always a struggle and trying to increase physical activity levels again, hasn't to date Lab Results  Component Value Date   HGBA1C 6.8 (H) 07/13/2019   HGBA1C 6.2 (H) 02/08/2019   HGBA1C 6.7 (H) 09/06/2018   Lab Results  Component Value Date   MICROALBUR 1.0 05/30/2018   LDLCALC 77 09/06/2018   CREATININE 1.16 02/08/2019     HLD:On a statin Lab Results  Component Value Date   CHOL 143 09/06/2018   HDL 41 09/06/2018   LDLCALC 77 09/06/2018   TRIG 124 09/06/2018   CHOLHDL 3.5 09/06/2018   No chest pain, shortness of breath, has some flutters that come and go, very short lasting,  no marked increased lower extremity swelling  Obesity - Morbid:Used to go to the gym, although not in the recent past.  Has not been exercising regularly He eats a balanced diet, trying to limit portions more and yet has not had luck with losing weight. Wt Readings from Last 3 Encounters:  04/04/20 (!) 302 lb 8 oz (137.2 kg)  07/27/19 (!) 304 lb (137.9 kg)  05/30/18 (!) 303 lb 11.2 oz (137.8 kg)     Diet/Exercise - as above noted  Depression: phq 9 is negative Depression screen [facility] 2/9  04/04/2020 07/27/2019 12/16/2018 08/30/2018 05/30/2018  Decreased Interest 0 0 0 1 0  Down, Depressed, Hopeless 0 0 0 0 0  PHQ - 2 Score 0 0 0 1 0  Altered sleeping - 0 0 0 0  Tired, decreased energy - 0 0 1 0  Change in appetite - 0 0 0 0  Feeling bad or failure about yourself  - 0 0 0 0  Trouble concentrating - 0 0 0 0  Moving slowly or fidgety/restless - 0 0 0 0  Suicidal thoughts - 0 0 0 0  PHQ-9 Score - 0 0 2 0  Difficult doing work/chores - Not difficult at all - Not difficult at all Not difficult at all    Hypertension:  BP Readings from Last 3 Encounters:  04/04/20 (!) 144/80  07/27/19 134/84  09/09/18 (!) 156/90    Obesity: Wt Readings from Last 3 Encounters:  04/04/20 (!) 302 lb 8 oz (137.2 kg)  07/27/19 (!) 304 lb (137.9 kg)  05/30/18 (!) 303 lb 11.2 oz (137.8 kg)   BMI Readings from Last 3 Encounters:  04/04/20 44.67 kg/m  07/27/19 44.89 kg/m  05/30/18 44.85 kg/m     Lipids:  Lab Results  Component Value Date   CHOL 143 09/06/2018   CHOL 142 02/07/2018   Lab Results  Component Value Date  HDL 41 09/06/2018   HDL 38 (L) 02/07/2018   Lab Results  Component Value Date   LDLCALC 77 09/06/2018   LDLCALC 84 02/07/2018   Lab Results  Component Value Date   TRIG 124 09/06/2018   TRIG 104 02/07/2018   Lab Results  Component Value Date   CHOLHDL 3.5 09/06/2018   CHOLHDL 3.7 02/07/2018   No results found for: LDLDIRECT  Glucose:  Glucose  Date Value Ref Range Status  02/08/2019 94 65 - 99 mg/dL Final  09/06/2018 128 (H) 65 - 99 mg/dL Final  08/05/2016 94 65 - 99 mg/dL Final   Glucose, Bld  Date Value Ref Range Status  02/07/2018 87 65 - 139 mg/dL Final    Comment:    .        Non-fasting reference interval .     [place] Office Visit from 07/27/2019 in [facility]  AUDIT-C Score 1     Tobacco-never smoker Alcohol-denied  Single STD testing and prevention (HIV/chl/gon/syphilis): Declines; negative HIV in  November 2019 Hep C: Negative in November 2019  Skin cancer: No concerning lesions noted in recent past by patient  Colorectal cancer: Brother with hx, pt has had benign polyps,  Last scope 02/2017 with 1 3 mm polyp removed, a tubular adenoma, follows up again recommended in 5 years.   No changes in BM's - no blood in stool, dark/tarry stool, or chronic abdominal pains  Prostate cancer/low libido history: No prostate cancer history in family or self.  He noted low libido previous, and a testosterone was ordered, although I do not see a result I do not think it was done. Did discuss today some concerns with testosterone supplementation if it is felt needed, and indications for it, mainly due to sexual concern for low testosterone. Offered to repeat the test last visit, versus adding it to future labs plan, and he agreed to check it again with his follow-up lab tests  Wants to have testosterone checked - noted needs to be first thing in the am  denies marked hesitancy, frequency, urgency symptoms, not up overnight to urinate, maybe once Has been following PSAs to help with prostate cancer screening standpoint Lab Results  Component Value Date   PSA 0.6 02/07/2018    Lung cancer:  N/A  Never smoker. Low Dose CT Chest recommended if Age 51-80 years, 30 pack-year currently smoking OR have quit w/in 15years. Patient does not qualify.   AAA: N/A; The USPSTF recommends one-time screening with ultrasonography in men ages 104 to 41 years who have ever smoked ECG:  On file from 2009, denies recent chest pains, shortness of breath  Advanced Care Planning: A voluntary discussion about advance care planning including the explanation and discussion of advance directives.  Discussed health care proxy and Living will, and the patient was able to identify a health care proxy as Daughter Administrator, sports) .  Patient does not have a living will at present time. If patient does have living will, I have  requested they bring this to the clinic to be scanned in to their chart.  Patient Active Problem List   Diagnosis Date Noted  . Constipation 12/16/2018  . Tinea pedis of both feet 12/16/2018  . Seasonal allergic rhinitis 05/30/2018  . Diet-controlled diabetes mellitus ([place]) 02/08/2018  . Hyperlipidemia associated with type 2 diabetes mellitus ([place]) 02/08/2018  . Vertigo 02/07/2018  . OSA (obstructive sleep apnea) 02/07/2018  . Tinnitus 06/16/2017  . Class 3 severe obesity  due to excess calories with serious comorbidity and body mass index (BMI) of 40.0 to 44.9 in adult [facility]) 09/25/2014  . FHx: migraine headaches 09/25/2014  . Other specified joint disorders, unspecified shoulder 04/16/2014  . 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 Family history of malignant neoplasm of gastrointestinal tract   . Personal history of colonic polyps     Past Surgical History:  Procedure Laterality Date  . COLONOSCOPY  02/03/2012   Dr. Jamal Collin  . COLONOSCOPY W/ POLYPECTOMY  2008   2m size sessile non bleeding polyp was found in the sigmoid colon  . COLONOSCOPY WITH PROPOFOL N/A 02/24/2017   Procedure: COLONOSCOPY WITH PROPOFOL;  Surgeon: SChristene Lye MD;  Location: [facility] ENDOSCOPY;  Service: Endoscopy;  Laterality: N/A;    Family History  Problem Relation Age of Onset  . Cancer Sister        breast  . Colon cancer Brother   . Diabetes Father     Social History   Socioeconomic History  . Marital status: Single    Spouse name: Not on file  . Number of children: 2  . Years of education: Not on file  . Highest education level: Not on file  Occupational History    Comment: [employer]  Tobacco Use  . Smoking status: Never Smoker  . Smokeless tobacco: Never Used  Vaping Use  . Vaping Use: Never used  Substance and Sexual Activity  . Alcohol use: No    Alcohol/week: 0.0 standard drinks  . Drug use: No  . Sexual activity: Not Currently  Other Topics Concern  . Not  on file  Social History Narrative  . Not on file   Social Determinants of Health   Financial Resource Strain: Low Risk   . Difficulty of Paying Living Expenses: Not hard at all  Food Insecurity: No Food Insecurity  . Worried About RCharity fundraiserin the Last Year: Never true  . Ran Out of Food in the Last Year: Never true  Transportation Needs: No Transportation Needs  . Lack of Transportation (Medical): No  . Lack of Transportation (Non-Medical): No  Physical Activity: Insufficiently Active  . Days of Exercise per Week: 2 days  . Minutes of Exercise per Session: 40 min  Stress: No Stress Concern Present  . Feeling of Stress : Only a little  Social Connections: Socially Integrated  . Frequency of Communication with Friends and Family: Twice a week  . Frequency of Social Gatherings with Friends and Family: Twice a week  . Attends Religious Services: More than 4 times per year  . Active Member of Clubs or Organizations: Yes  . Attends CArchivistMeetings: More than 4 times per year  . Marital Status: Living with partner  Intimate Partner Violence: Not At Risk  . Fear of Current or Ex-Partner: No  . Emotionally Abused: No  . Physically Abused: No  . Sexually Abused: No     Current Outpatient Medications:  .  Blood Glucose Monitoring Suppl (CONTOUR NEXT ONE) KIT, 1 each by Does not apply route daily. Please check blood sugar once daily.  May check 1 additional time per day as needed., Disp: 1 kit, Rfl: 0 .  cholecalciferol (VITAMIN D) 1000 units tablet, Take 1,000 Units daily by mouth., Disp: , Rfl:  .  fexofenadine (ALLEGRA ALLERGY) 180 MG tablet, Take 1 tablet (180 mg total) by mouth daily., Disp: 90 tablet, Rfl: 1 .  fluticasone (FLONASE) 50 MCG/ACT nasal spray, Place 2  sprays into both nostrils daily., Disp: 16 g, Rfl: 6 .  glucose blood test strip, Use as instructed, Disp: 100 each, Rfl: 12 .  metFORMIN (GLUCOPHAGE) 500 MG tablet, Take 1 tablet (500 mg total) by  mouth daily with breakfast. Increase to twice daily in 1-2 weeks if no improvements in blood sugars on checks., Disp: 90 tablet, Rfl: 5 .  rosuvastatin (CRESTOR) 10 MG tablet, Take 1 tablet (10 mg total) by mouth daily., Disp: 90 tablet, Rfl: 3  No Known Allergies   ROS: As noted above in HPI denies any recent unintentional weight loss, increased fatigue No recent fevers or other Covid concerning sx's, had vaccine and booster No increase headaches, vision changes No CP,  No increased SOB,  No persistent abdominal pains or change in bowel habits,  No rectal bleeding or dark/black stools,  No flank pains or dysuria,  No frequency or urgency No increase joint aches or muscle aches,  No numbness, tingling or weakness in the extremities Denies other specific complaints on systems review except as noted in HPI    Objective  Vitals:   04/04/20 1041  BP: (!) 144/80  Pulse: 75  Resp: 16  Temp: 98.4 F (36.9 C)  TempSrc: Oral  SpO2: 97%  Weight: (!) 302 lb 8 oz (137.2 kg)  Height: 5' 9"  (1.753 m)    Body mass index is 44.67 kg/m.  NAD, pleasant, masked HEENT - sclera anicteric, PERRL, EOMI, conj - non-inj'ed,  pharynx clear Neck - supple, no adenopathy, no TM, carotids 2+ and = without bruits bilat Car - RRR without m/g/r Pulm- CTA without wheeze or rales Abd - soft, NT, obese, ND, BS+, no obvious HSM, no masses Back - no CVA tenderness,  Skin- no new lesions of concern on exposed areas, denied otherwise Ext - no LE edema,  GU -declined as he denies any testicle lumps or masses, no lesions of concern and did not feel a exam was needed,  rectal: not done, prostate exam deferred (without concerning sx's and after discussion on current recommendations for prostate CA screening including PSA tests) Neuro - affect was not flat, appropriate with conversation  Grossly non-focal with good strength on testing, sensation intact to LT in distal extremities,  Romberg neg, no pronator  drift, good balance on one foot, good finger to nose    PHQ2/9: Depression screen [facility] 2/9 04/04/2020 07/27/2019 12/16/2018 08/30/2018 05/30/2018  Decreased Interest 0 0 0 1 0  Down, Depressed, Hopeless 0 0 0 0 0  PHQ - 2 Score 0 0 0 1 0  Altered sleeping - 0 0 0 0  Tired, decreased energy - 0 0 1 0  Change in appetite - 0 0 0 0  Feeling bad or failure about yourself  - 0 0 0 0  Trouble concentrating - 0 0 0 0  Moving slowly or fidgety/restless - 0 0 0 0  Suicidal thoughts - 0 0 0 0  PHQ-9 Score - 0 0 2 0  Difficult doing work/chores - Not difficult at all - Not difficult at all Not difficult at all    Fall Risk: Fall Risk  04/04/2020 07/27/2019 12/16/2018 08/30/2018 05/30/2018  Falls in the past year? 0 0 0 0 0  Comment - - - - -  Number falls in past yr: 0 0 0 0 0  Injury with Fall? 0 0 0 0 0  Follow up - - - - Falls evaluation completed     Functional Status Survey: Is  the patient deaf or have difficulty hearing?: No Does the patient have difficulty seeing, even when wearing glasses/contacts?: No Does the patient have difficulty concentrating, remembering, or making decisions?: No Does the patient have difficulty walking or climbing stairs?: No Does the patient have difficulty dressing or bathing?: No Does the patient have difficulty doing errands alone such as visiting a doctor's office or shopping?: No   Assessment & Plan  General Health/Health Maintenance/annual physical:   -Prostate cancer screening and PSA options (with potential risks and benefits of testing vs not testing) were discussed along with recent recs/guidelines.  Continue to follow PSAs and 1 ordered with today's labs  -USPSTF grade A and B recommendations reviewed with patient; age-appropriate recommendations, preventive care, screening tests, etc discussed and encouraged; healthy living encouraged; see AVS for any further patient education given to patient  -Discussed importance of regular physical activity  weekly, and encouraged to getting more active again.  -Discussed importance of eating healthy diet: Information provided in the AVS as well to help with diabetes diet  -Reviewed Health Maintenance:  Adult vaccines due  Topic Date Due  . TETANUS/TDAP  02/08/2028  . PNEUMOCOCCAL POLYSACCHARIDE VACCINE AGE 20-64 HIGH RISK  Completed    1. Type 2 diabetes mellitus without complication, without long-term current use of insulin (HCC) Discussed again today the importance of keeping his blood sugars better controlled with respect to protecting his kidneys, heart and overall health in the future.   He did not increase the metformin previously, and did recommend he do so, and will prescribe 1000 mg once daily.  (Can take 2 500 mg tablets until he runs out, then take one of the 1000 mg tablet that was prescribed today daily)  Recheck labs felt best, and he noted he can get them done at [facility] at no cost, and wanted to have them put on a prescription to take over to have them drawn there.  Unclear how we will get those results then to review.  Did put the orders recommended for labs on a prescription for him today and include a CBC, complete metabolic panel, lipid panel, TSH, testosterone level, urine for microalbumin, and an A1c.  Continuing with dietary modifications and trying to stay active with increased physical activity was also emphasized as important moving forward as is weight management, and encouraged trying to lose weight in the future to help.    He is already on a statin and the importance of that continuing was emphasized as well.   2. Morbid obesity ([place]) As noted above, weight loss would be beneficial, and on a prior visit briefly discussed a medicine that can help both with weight loss and with blood sugar control.  Noted it was injectable.  He was not interested in an injectable medicine at that point. Continuing with attempts at weight loss through exercise and dietary  modification  3. OSA (obstructive sleep apnea) Continue the CPAP, as he does think it has been helpful  4. Hyperlipidemia associated with type 2 diabetes mellitus ([place]) Continue the statin The goal with diabetes is an LDL less than 70  5. Low libido I did note that the testosterone level did not seem to be run from the prior order and can order a testosterone level with his labs presently..   Did educate on this topic today and did last visit as well.   Do feel adding a TSH for thyroid screen was indicated and also added to his labs to be drawn.  Complicating the picture is that today is the last day I will be seeing patients at this practice, I did emphasize the importance of follow-up in the short-term for getting his blood sugars better controlled. Also unclear where these labs will be sent once they are obtained.  I did recommend a follow-up in a 3-week timeframe at the latest, as he notes he can get his labs drawn in the next week or 2 at the [place].  Did ask he bring a copy of those labs to the follow-up visit to help have them reviewed, and also then follow-up with a new provider who will continue to help with management over time.  Can follow-up sooner as needed.  Shiela Mayer, MD

## 2020-04-04 ENCOUNTER — Other Ambulatory Visit: Payer: Self-pay

## 2020-04-04 ENCOUNTER — Ambulatory Visit: Payer: BC Managed Care – PPO | Admitting: Internal Medicine

## 2020-04-04 ENCOUNTER — Encounter: Payer: Self-pay | Admitting: Internal Medicine

## 2020-04-04 VITALS — BP 144/80 | HR 75 | Temp 98.4°F | Resp 16 | Ht 69.0 in | Wt 302.5 lb

## 2020-04-04 DIAGNOSIS — E1169 Type 2 diabetes mellitus with other specified complication: Secondary | ICD-10-CM

## 2020-04-04 DIAGNOSIS — E785 Hyperlipidemia, unspecified: Secondary | ICD-10-CM

## 2020-04-04 DIAGNOSIS — E119 Type 2 diabetes mellitus without complications: Secondary | ICD-10-CM | POA: Diagnosis not present

## 2020-04-04 DIAGNOSIS — Z0000 Encounter for general adult medical examination without abnormal findings: Secondary | ICD-10-CM

## 2020-04-04 DIAGNOSIS — R6882 Decreased libido: Secondary | ICD-10-CM

## 2020-04-04 DIAGNOSIS — Z125 Encounter for screening for malignant neoplasm of prostate: Secondary | ICD-10-CM

## 2020-04-04 DIAGNOSIS — G4733 Obstructive sleep apnea (adult) (pediatric): Secondary | ICD-10-CM | POA: Diagnosis not present

## 2020-04-04 MED ORDER — METFORMIN HCL 1000 MG PO TABS
1000.0000 mg | ORAL_TABLET | Freq: Every day | ORAL | 0 refills | Status: DC
Start: 2020-04-04 — End: 2020-07-16

## 2020-04-04 NOTE — Patient Instructions (Signed)
Type 2 Diabetes Mellitus, Self-Care, Adult When you have type 2 diabetes (type 2 diabetes mellitus), you must make sure your blood sugar (glucose) stays in a healthy range. You can do this with:  Nutrition.  Exercise.  Lifestyle changes.  Medicines or insulin, if needed.  Support from your doctors and others. What are the risks? Having diabetes can raise your risk for other long-term (chronic) health problems. You may get medicines to help prevent these problems. How to stay aware of blood sugar  Check your blood sugar level every day, as often as told.  Have your A1C (hemoglobin A1C) level checked two or more times a year. Have it checked more often if told.  Your doctor will set personal treatment goals for you. In general, you should have these blood sugar levels: ? Before meals: 80-130 mg/dL (4.4-7.2 mmol/L). ? After meals: below 180 mg/dL (10 mmol/L). ? A1C: less than 7%.   How to manage high and low blood sugar Symptoms of high blood sugar High blood sugar is also called hyperglycemia. Know the symptoms of high blood sugar. These may include:  More thirst.  Hunger.  Feeling very tired.  Needing to pee (urinate) more often than normal.  Seeing things blurry. Symptoms of low blood sugar Low blood sugar is also called hypoglycemia. This is when blood sugar is at or below 70 mg/dL (3.9 mmol/L). Symptoms may include:  Hunger.  Feeling worried or nervous (anxious).  Feeling sweaty and cold to the touch (clammy).  Being dizzy or light-headed.  Feeling sleepy.  A fast heartbeat.  Feeling grouchy (irritable).  Tingling or loss of feeling (numbness) around your mouth, lips, or tongue.  Restless sleep. Diabetes medicines can cause low blood sugar. You are more at risk:  While you exercise.  After exercise.  During sleep.  When you are sick.  When you skip meals or do not eat for a long time. Treating low blood sugar If you think you have low blood  sugar, eat or drink something sugary right away. Keep 15 grams of a fast-acting carb (carbohydrate) with you all the time. Make sure your family and friends know how to treat you if you cannot treat yourself. Treating very low blood sugar Severe hypoglycemia is when your blood sugar is at or below 54 mg/dL (3 mmol/L). Severe hypoglycemia is an emergency. Do not wait to see if the symptoms will go away. Get medical help right away. Call your local emergency services (911 in the [place] Do not drive yourself to the hospital. You may need a glucagon shot if you have very low blood sugar and you cannot eat or drink. Have a family member or friend learn how to check your blood sugar and how to give you a glucagon shot. Ask your doctor if you should have a kit for glucagon shots. Follow these instructions at home: Medicines  Take diabetes medicines as told. If your doctor prescribed insulin or diabetes medicines, take them each day.  Do not run out of insulin or other medicines. Plan ahead.  If you use insulin, change the amount you take based on how active you are and what foods you eat. Your doctor will tell you how to do this.  Take over-the-counter and prescription medicines only as told by your doctor. Eating and drinking  Eat healthy foods. These include: ? Low-fat (lean) proteins. ? Complex carbs, such as whole grains. ? Fresh fruits and vegetables. ? Low-fat dairy products. ? Healthy fats.  Meet with   a food expert (dietitian) to make an eating plan.  Follow instructions from your doctor about what you cannot eat or drink.  Drink enough fluid to keep your pee (urine) pale yellow.  Keep track of carbs that you eat. Read food labels and learn serving sizes of foods.  Follow your sick-day plan when you cannot eat or drink as normal. Make this plan with your doctor so it is ready to use.   Activity  Exercise as told by your doctor. You may need to: ? Do stretching and strength  exercises 2 or more times a week. ? Do 150 minutes or more of exercise each week that makes your heart beat faster and makes you sweat.  Spread out your exercise over 3 or more days a week.  Do not go more than 2 days in a row without exercise.  Talk with your doctor before you start a new exercise. Your doctor may tell you to change: ? How much insulin or medicines you take. ? How much food you eat. Lifestyle  Do not use any products that contain nicotine or tobacco, such as cigarettes, e-cigarettes, and chewing tobacco. If you need help quitting, ask your doctor.  If you drink alcohol and your doctor says alcohol is safe for you: ? Limit how much you use to:  0-1 drink a day for women who are not pregnant.  0-2 drinks a day for men. ? Be aware of how much alcohol is in your drink. In the [place] one drink equals one 12 oz bottle of beer (355 mL), one 5 oz glass of wine (148 mL), or one 1 oz glass of hard liquor (44 mL).  Learn to deal with stress. If you need help, ask your doctor. Body care  Stay up to date with your shots (immunizations).  Have your eyes and feet checked by a doctor as often as told.  Check your skin and feet every day. Check for cuts, bruises, redness, blisters, or sores.  Brush your teeth and gums two times a day. Floss one or more times a day.  Go to the dentist one or more times every 6 months.  Stay at a healthy weight.   General instructions  Share your diabetes care plan with: ? Your work or school. ? People you live with.  Carry a card or wear jewelry that says you have diabetes.  Keep all follow-up visits as told by your doctor. This is important. Questions to ask your doctor  Do I need to meet with a certified expert in diabetes education and care?  Where can I find a support group? Where to find more information  American Diabetes Association: www.diabetes.org  American Association of Diabetes Care and Education Specialists:  www.diabeteseducator.org  International Diabetes Federation: MemberVerification.ca Summary  When you have type 2 diabetes, you must make sure your blood sugar (glucose) stays in a healthy range. You can do this with nutrition, exercise, medicines and insulin, and support from doctors and others.  Check your blood sugar every day, or as often as told.  Having diabetes can raise your risk for other long-term health problems. You may get medicines to help prevent these problems.  Share your diabetes management plan with people at work, school, and home.  Keep all follow-up visits as told by your doctor. This is important. This information is not intended to replace advice given to you by your health care provider. Make sure you discuss any questions you have with your health  care provider. Document Revised: 10/04/2019 Document Reviewed: 10/04/2019 Elsevier Patient Education  2021 Elsevier Inc.   Diabetes Mellitus and Nutrition, Adult When you have diabetes, or diabetes mellitus, it is very important to have healthy eating habits because your blood sugar (glucose) levels are greatly affected by what you eat and drink. Eating healthy foods in the right amounts, at about the same times every day, can help you:  Control your blood glucose.  Lower your risk of heart disease.  Improve your blood pressure.  Reach or maintain a healthy weight. What can affect my meal plan? Every person with diabetes is different, and each person has different needs for a meal plan. Your health care provider may recommend that you work with a dietitian to make a meal plan that is best for you. Your meal plan may vary depending on factors such as:  The calories you need.  The medicines you take.  Your weight.  Your blood glucose, blood pressure, and cholesterol levels.  Your activity level.  Other health conditions you have, such as heart or kidney disease. How do carbohydrates affect me? Carbohydrates, also  called carbs, affect your blood glucose level more than any other type of food. Eating carbs naturally raises the amount of glucose in your blood. Carb counting is a method for keeping track of how many carbs you eat. Counting carbs is important to keep your blood glucose at a healthy level, especially if you use insulin or take certain oral diabetes medicines. It is important to know how many carbs you can safely have in each meal. This is different for every person. Your dietitian can help you calculate how many carbs you should have at each meal and for each snack. How does alcohol affect me? Alcohol can cause a sudden decrease in blood glucose (hypoglycemia), especially if you use insulin or take certain oral diabetes medicines. Hypoglycemia can be a life-threatening condition. Symptoms of hypoglycemia, such as sleepiness, dizziness, and confusion, are similar to symptoms of having too much alcohol.  Do not drink alcohol if: ? Your health care provider tells you not to drink. ? You are pregnant, may be pregnant, or are planning to become pregnant.  If you drink alcohol: ? Do not drink on an empty stomach. ? Limit how much you use to:  0-1 drink a day for women.  0-2 drinks a day for men. ? Be aware of how much alcohol is in your drink. In the [place] one drink equals one 12 oz bottle of beer (355 mL), one 5 oz glass of wine (148 mL), or one 1 oz glass of hard liquor (44 mL). ? Keep yourself hydrated with water, diet soda, or unsweetened iced tea.  Keep in mind that regular soda, juice, and other mixers may contain a lot of sugar and must be counted as carbs. What are tips for following this plan? Reading food labels  Start by checking the serving size on the "Nutrition Facts" label of packaged foods and drinks. The amount of calories, carbs, fats, and other nutrients listed on the label is based on one serving of the item. Many items contain more than one serving per package.  Check the  total grams (g) of carbs in one serving. You can calculate the number of servings of carbs in one serving by dividing the total carbs by 15. For example, if a food has 30 g of total carbs per serving, it would be equal to 2 servings of carbs.  Check   the number of grams (g) of saturated fats and trans fats in one serving. Choose foods that have a low amount or none of these fats.  Check the number of milligrams (mg) of salt (sodium) in one serving. Most people should limit total sodium intake to less than 2,300 mg per day.  Always check the nutrition information of foods labeled as "low-fat" or "nonfat." These foods may be higher in added sugar or refined carbs and should be avoided.  Talk to your dietitian to identify your daily goals for nutrients listed on the label. Shopping  Avoid buying canned, pre-made, or processed foods. These foods tend to be high in fat, sodium, and added sugar.  Shop around the outside edge of the grocery store. This is where you will most often find fresh fruits and vegetables, bulk grains, fresh meats, and fresh dairy. Cooking  Use low-heat cooking methods, such as baking, instead of high-heat cooking methods like deep frying.  Cook using healthy oils, such as olive, canola, or sunflower oil.  Avoid cooking with butter, cream, or high-fat meats. Meal planning  Eat meals and snacks regularly, preferably at the same times every day. Avoid going long periods of time without eating.  Eat foods that are high in fiber, such as fresh fruits, vegetables, beans, and whole grains. Talk with your dietitian about how many servings of carbs you can eat at each meal.  Eat 4-6 oz (112-168 g) of lean protein each day, such as lean meat, chicken, fish, eggs, or tofu. One ounce (oz) of lean protein is equal to: ? 1 oz (28 g) of meat, chicken, or fish. ? 1 egg. ?  cup (62 g) of tofu.  Eat some foods each day that contain healthy fats, such as avocado, nuts, seeds, and  fish.   What foods should I eat? Fruits Berries. Apples. Oranges. Peaches. Apricots. Plums. Grapes. Mango. Papaya. Pomegranate. Kiwi. Cherries. Vegetables Lettuce. Spinach. Leafy greens, including kale, chard, collard greens, and mustard greens. Beets. Cauliflower. Cabbage. Broccoli. Carrots. Green beans. Tomatoes. Peppers. Onions. Cucumbers. Brussels sprouts. Grains Whole grains, such as whole-wheat or whole-grain bread, crackers, tortillas, cereal, and pasta. Unsweetened oatmeal. Quinoa. Brown or wild rice. Meats and other proteins Seafood. Poultry without skin. Lean cuts of poultry and beef. Tofu. Nuts. Seeds. Dairy Low-fat or fat-free dairy products such as milk, yogurt, and cheese. The items listed above may not be a complete list of foods and beverages you can eat. Contact a dietitian for more information. What foods should I avoid? Fruits Fruits canned with syrup. Vegetables Canned vegetables. Frozen vegetables with butter or cream sauce. Grains Refined white flour and flour products such as bread, pasta, snack foods, and cereals. Avoid all processed foods. Meats and other proteins Fatty cuts of meat. Poultry with skin. Breaded or fried meats. Processed meat. Avoid saturated fats. Dairy Full-fat yogurt, cheese, or milk. Beverages Sweetened drinks, such as soda or iced tea. The items listed above may not be a complete list of foods and beverages you should avoid. Contact a dietitian for more information. Questions to ask a health care provider  Do I need to meet with a diabetes educator?  Do I need to meet with a dietitian?  What number can I call if I have questions?  When are the best times to check my blood glucose? Where to find more information:  American Diabetes Association: diabetes.org  Academy of Nutrition and Dietetics: www.eatright.org  National Institute of Diabetes and Digestive and Kidney Diseases: www.niddk.nih.gov    Association of Diabetes Care and  Education Specialists: www.diabeteseducator.org Summary  It is important to have healthy eating habits because your blood sugar (glucose) levels are greatly affected by what you eat and drink.  A healthy meal plan will help you control your blood glucose and maintain a healthy lifestyle.  Your health care provider may recommend that you work with a dietitian to make a meal plan that is best for you.  Keep in mind that carbohydrates (carbs) and alcohol have immediate effects on your blood glucose levels. It is important to count carbs and to use alcohol carefully. This information is not intended to replace advice given to you by your health care provider. Make sure you discuss any questions you have with your health care provider. Document Revised: 02/07/2019 Document Reviewed: 02/07/2019 Elsevier Patient Education  2021 Elsevier Inc.   

## 2020-04-08 ENCOUNTER — Telehealth: Payer: Self-pay

## 2020-04-08 NOTE — Telephone Encounter (Signed)
Copied from [id number]. Topic: General - Other >> Apr 08, 2020  1:29 PM Tessa Lerner A wrote: Reason for CRM:  Lenna Sciara was entering lab information for the patient and noticed that there was no diagnosis code for the patient.  Would like to be contacted with the correct diagnosis code for verification when possible.   Tried contacting Melissa back to find out what labs where ordered and was not able to reach her nor leave message. The phone line kept saying, "to check your message dail one now".

## 2020-04-08 NOTE — Telephone Encounter (Signed)
What labs did he order. I don't see them in the system

## 2020-04-09 ENCOUNTER — Other Ambulatory Visit: Payer: Self-pay

## 2020-04-09 ENCOUNTER — Other Ambulatory Visit: Payer: BC Managed Care – PPO

## 2020-04-09 DIAGNOSIS — Z0000 Encounter for general adult medical examination without abnormal findings: Secondary | ICD-10-CM

## 2020-04-10 LAB — COMPREHENSIVE METABOLIC PANEL
ALT: 26 IU/L (ref 0–44)
AST: 31 IU/L (ref 0–40)
Albumin/Globulin Ratio: 1.6 (ref 1.2–2.2)
Albumin: 4.7 g/dL (ref 3.8–4.9)
Alkaline Phosphatase: 120 IU/L (ref 44–121)
BUN/Creatinine Ratio: 12 (ref 10–24)
BUN: 14 mg/dL (ref 8–27)
Bilirubin Total: 0.4 mg/dL (ref 0.0–1.2)
CO2: 27 mmol/L (ref 20–29)
Calcium: 9.5 mg/dL (ref 8.6–10.2)
Chloride: 101 mmol/L (ref 96–106)
Creatinine, Ser: 1.19 mg/dL (ref 0.76–1.27)
GFR calc Af Amer: 76 mL/min/{1.73_m2} (ref >59)
GFR calc non Af Amer: 66 mL/min/{1.73_m2} (ref >59)
Globulin, Total: 2.9 g/dL (ref 1.5–4.5)
Glucose: 133 mg/dL — ABNORMAL HIGH (ref 65–99)
Potassium: 4.3 mmol/L (ref 3.5–5.2)
Sodium: 141 mmol/L (ref 134–144)
Total Protein: 7.6 g/dL (ref 6.0–8.5)

## 2020-04-10 LAB — LIPID PANEL
Chol/HDL Ratio: 2.4 ratio (ref 0.0–5.0)
Cholesterol, Total: 105 mg/dL (ref 100–199)
HDL: 44 mg/dL (ref >39)
LDL Chol Calc (NIH): 47 mg/dL (ref 0–99)
Triglycerides: 64 mg/dL (ref 0–149)
VLDL Cholesterol Cal: 14 mg/dL (ref 5–40)

## 2020-04-10 LAB — CBC WITH DIFFERENTIAL/PLATELET
Basophils Absolute: 0 10*3/uL (ref 0.0–0.2)
Basos: 1 %
EOS (ABSOLUTE): 0.2 10*3/uL (ref 0.0–0.4)
Eos: 5 %
Hematocrit: 49.3 % (ref 37.5–51.0)
Hemoglobin: 17 g/dL (ref 13.0–17.7)
Immature Grans (Abs): 0 10*3/uL (ref 0.0–0.1)
Immature Granulocytes: 0 %
Lymphocytes Absolute: 1.8 10*3/uL (ref 0.7–3.1)
Lymphs: 36 %
MCH: 30.4 pg (ref 26.6–33.0)
MCHC: 34.5 g/dL (ref 31.5–35.7)
MCV: 88 fL (ref 79–97)
Monocytes Absolute: 0.5 10*3/uL (ref 0.1–0.9)
Monocytes: 9 %
Neutrophils Absolute: 2.4 10*3/uL (ref 1.4–7.0)
Neutrophils: 49 %
Platelets: 292 10*3/uL (ref 150–450)
RBC: 5.59 x10E6/uL (ref 4.14–5.80)
RDW: 13 % (ref 11.6–15.4)
WBC: 4.9 10*3/uL (ref 3.4–10.8)

## 2020-04-10 LAB — TSH: TSH: 1.06 u[IU]/mL (ref 0.450–4.500)

## 2020-04-10 LAB — PSA: Prostate Specific Ag, Serum: 0.9 ng/mL (ref 0.0–4.0)

## 2020-04-10 LAB — HGB A1C W/O EAG: Hgb A1c MFr Bld: 6.7 % — ABNORMAL HIGH (ref 4.8–5.6)

## 2020-04-10 LAB — MICROALBUMIN / CREATININE URINE RATIO
Creatinine, Urine: 83.6 mg/dL
Microalb/Creat Ratio: 17 mg/g creat (ref 0–29)
Microalbumin, Urine: 14.2 ug/mL

## 2020-04-10 LAB — TESTOSTERONE: Testosterone: 417 ng/dL (ref 264–916)

## 2020-06-04 ENCOUNTER — Encounter: Payer: Self-pay | Admitting: Medical

## 2020-06-04 ENCOUNTER — Ambulatory Visit: Payer: BC Managed Care – PPO | Admitting: Medical

## 2020-06-04 ENCOUNTER — Ambulatory Visit: Payer: BC Managed Care – PPO

## 2020-06-04 ENCOUNTER — Other Ambulatory Visit: Payer: Self-pay

## 2020-06-04 VITALS — BP 128/80 | HR 86 | Temp 98.2°F | Resp 16 | Wt 298.0 lb

## 2020-06-04 DIAGNOSIS — R0982 Postnasal drip: Secondary | ICD-10-CM

## 2020-06-04 DIAGNOSIS — Z20822 Contact with and (suspected) exposure to covid-19: Secondary | ICD-10-CM

## 2020-06-04 DIAGNOSIS — H6983 Other specified disorders of Eustachian tube, bilateral: Secondary | ICD-10-CM

## 2020-06-04 DIAGNOSIS — J301 Allergic rhinitis due to pollen: Secondary | ICD-10-CM

## 2020-06-04 LAB — POC COVID19 BINAXNOW: SARS Coronavirus 2 Ag: NEGATIVE

## 2020-06-04 NOTE — Patient Instructions (Addendum)
Guaifenesin oral ER tablets What is this medicine? GUAIFENESIN (gwye FEN e sin) is an expectorant. It helps to thin mucous and make coughs more productive. This medicine is used to treat coughs caused by colds or the flu. It is not intended to treat chronic cough caused by smoking, asthma, emphysema, or heart failure. This medicine may be used for other purposes; ask your health care provider or pharmacist if you have questions. COMMON BRAND NAME(S): Humibid, Mucinex What should I tell my health care provider before I take this medicine? They need to know if you have any of these conditions:  fever  kidney disease  an unusual or allergic reaction to guaifenesin, other medicines, foods, dyes, or preservatives  pregnant or trying to get pregnant  breast-feeding How should I use this medicine? Take this medicine by mouth with a full glass of water. Follow the directions on the prescription label. Do not break, chew or crush this medicine. You may take with food or on an empty stomach. Take your medicine at regular intervals. Do not take your medicine more often than directed. Talk to your pediatrician regarding the use of this medicine in children. While this drug may be prescribed for children as young as 49 years old for selected conditions, precautions do apply. Overdosage: If you think you have taken too much of this medicine contact a poison control center or emergency room at once. NOTE: This medicine is only for you. Do not share this medicine with others. What if I miss a dose? If you miss a dose, take it as soon as you can. If it is almost time for your next dose, take only that dose. Do not take double or extra doses. What may interact with this medicine? Interactions are not expected. This list may not describe all possible interactions. Give your health care provider a list of all the medicines, herbs, non-prescription drugs, or dietary supplements you use. Also tell them if you  smoke, drink alcohol, or use illegal drugs. Some items may interact with your medicine. What should I watch for while using this medicine? Do not treat a cough for more than 1 week without consulting your doctor or health care professional. If you also have a high fever, skin rash, continuing headache, or sore throat, see your doctor. For best results, drink 6 to 8 glasses water daily while you are taking this medicine. What side effects may I notice from receiving this medicine? Side effects that you should report to your doctor or health care professional as soon as possible:  allergic reactions like skin rash, itching or hives, swelling of the face, lips, or tongue Side effects that usually do not require medical attention (report to your doctor or health care professional if they continue or are bothersome):  dizziness  headache  stomach upset This list may not describe all possible side effects. Call your doctor for medical advice about side effects. You may report side effects to FDA at 1-800-FDA-1088. Where should I keep my medicine? Keep out of the reach of children. Store at room temperature between 20 and 25 degrees C (68 and 77 degrees F). Keep container tightly closed. Throw away any unused medicine after the expiration date. NOTE: This sheet is a summary. It may not cover all possible information. If you have questions about this medicine, talk to your doctor, pharmacist, or health care provider.  2021 Elsevier/Gold Standard (2007-07-13 12:14:14)  Allergic Rhinitis, Adult Allergic rhinitis is a reaction to allergens. Allergens are  things that can cause an allergic reaction. This condition affects the lining inside the nose (mucous membrane). There are two types of allergic rhinitis:  Seasonal. This type is also called hay fever. It happens only during some times of the year.  Perennial. This type can happen at any time of the year. This condition cannot be spread from  person to person (is not contagious). It can be mild, worse, or very bad. It can develop at any age and may be outgrown. What are the causes? This condition may be caused by:  Pollen from grasses, trees, and weeds.  Dust mites.  Smoke.  Mold.  Car fumes.  The pee (urine), spit, or dander of pets. Dander is dead skin cells from a pet.   What increases the risk? You are more likely to develop this condition if:  You have allergies in your family.  You have problems like allergies in your family. You may have: ? Swelling of parts of your eyes and eyelids. ? Asthma. This affects how you breathe. ? Long-term redness and swelling on your skin. ? Food allergies. What are the signs or symptoms? The main symptom of this condition is a runny or stuffy nose (nasal congestion). Other symptoms may include:  Sneezing or coughing.  Itching and tearing of your eyes.  Mucus that drips down the back of your throat (postnasal drip).  Trouble sleeping.  Feeling tired.  Headache.  Sore throat. How is this treated? There is no cure for this condition. You should avoid things that you are allergic to. Treatment can help to relieve symptoms. This may include:  Medicines that block allergy symptoms, such as corticosteroids or antihistamines. These may be given as a shot, nasal spray, or pill.  Avoiding things you are allergic to.  Medicines that give you bits of what you are allergic to over time. This is called immunotherapy. It is done if other treatments do not help. You may get: ? Shots. ? Medicine under your tongue.  Stronger medicines, if other treatments do not help. Follow these instructions at home: Avoiding allergens Find out what things you are allergic to and avoid them. To do this, try these things:  If you get allergies any time of year: ? Replace carpet with wood, tile, or vinyl flooring. Carpet can trap pet dander and dust. ? Do not smoke. Do not allow smoking in  your home. ? Change your heating and air conditioning filters at least once a month.  If you get allergies only some times of the year: ? Keep windows closed when you can. ? Plan things to do outside when pollen counts are lowest. Check pollen counts before you plan things to do outside. ? When you come indoors, change your clothes and shower before you sit on furniture or bedding.   If you are allergic to a pet: ? Keep the pet out of your bedroom. ? Vacuum, sweep, and dust often.   General instructions  Take over-the-counter and prescription medicines only as told by your doctor.  Drink enough fluid to keep your pee (urine) pale yellow.  Keep all follow-up visits as told by your doctor. This is important. Where to find more information  American Academy of Allergy, Asthma & Immunology: www.aaaai.org Contact a doctor if:  You have a fever.  You get a cough that does not go away.  You make whistling sounds when you breathe (wheeze).  Your symptoms slow you down.  Your symptoms stop you from doing  your normal things each day. Get help right away if:  You are short of breath. This symptom may be an emergency. Do not wait to see if the symptom will go away. Get medical help right away. Call your local emergency services (911 in the [place] Do not drive yourself to the hospital. Summary  Allergic rhinitis may be treated by taking medicines and avoiding things you are allergic to.  If you have allergies only some of the year, keep windows closed when you can at those times.  Contact your doctor if you get a fever or a cough that does not go away. This information is not intended to replace advice given to you by your health care provider. Make sure you discuss any questions you have with your health care provider. Document Revised: 04/24/2019 Document Reviewed: 02/28/2019 Elsevier Patient Education  [place].  Eustachian Tube Dysfunction  Eustachian tube  dysfunction refers to a condition in which a blockage develops in the narrow passage that connects the middle ear to the back of the nose (eustachian tube). The eustachian tube regulates air pressure in the middle ear by letting air move between the ear and nose. It also helps to drain fluid from the middle ear space. Eustachian tube dysfunction can affect one or both ears. When the eustachian tube does not function properly, air pressure, fluid, or both can build up in the middle ear. What are the causes? This condition occurs when the eustachian tube becomes blocked or cannot open normally. Common causes of this condition include:  Ear infections.  Colds and other infections that affect the nose, mouth, and throat (upper respiratory tract).  Allergies.  Irritation from cigarette smoke.  Irritation from stomach acid coming up into the esophagus (gastroesophageal reflux). The esophagus is the tube that carries food from the mouth to the stomach.  Sudden changes in air pressure, such as from descending in an airplane or scuba diving.  Abnormal growths in the nose or throat, such as: ? Growths that line the nose (nasal polyps). ? Abnormal growth of cells (tumors). ? Enlarged tissue at the back of the throat (adenoids). What increases the risk? You are more likely to develop this condition if:  You smoke.  You are overweight.  You are a child who has: ? Certain birth defects of the mouth, such as cleft palate. ? Large tonsils or adenoids. What are the signs or symptoms? Common symptoms of this condition include:  A feeling of fullness in the ear.  Ear pain.  Clicking or popping noises in the ear.  Ringing in the ear.  Hearing loss.  Loss of balance.  Dizziness. Symptoms may get worse when the air pressure around you changes, such as when you travel to an area of high elevation, fly on an airplane, or go scuba diving. How is this diagnosed? This condition may be diagnosed  based on:  Your symptoms.  A physical exam of your ears, nose, and throat.  Tests, such as those that measure: ? The movement of your eardrum (tympanogram). ? Your hearing (audiometry). How is this treated? Treatment depends on the cause and severity of your condition.  In mild cases, you may relieve your symptoms by moving air into your ears. This is called "popping the ears."  In more severe cases, or if you have symptoms of fluid in your ears, treatment may include: ? Medicines to relieve congestion (decongestants). ? Medicines that treat allergies (antihistamines). ? Nasal sprays or ear drops that  contain medicines that reduce swelling (steroids). ? A procedure to drain the fluid in your eardrum (myringotomy). In this procedure, a small tube is placed in the eardrum to:  Drain the fluid.  Restore the air in the middle ear space. ? A procedure to insert a balloon device through the nose to inflate the opening of the eustachian tube (balloon dilation). Follow these instructions at home: Lifestyle  Do not do any of the following until your health care provider approves: ? Travel to high altitudes. ? Fly in airplanes. ? Work in a P[employer] scheme manager or room. ? Scuba dive.  Do not use any products that contain nicotine or tobacco, such as cigarettes and e-cigarettes. If you need help quitting, ask your health care provider.  Keep your ears dry. Wear fitted earplugs during showering and bathing. Dry your ears completely after. General instructions  Take over-the-counter and prescription medicines only as told by your health care provider.  Use techniques to help pop your ears as recommended by your health care provider. These may include: ? Chewing gum. ? Yawning. ? Frequent, forceful swallowing. ? Closing your mouth, holding your nose closed, and gently blowing as if you are trying to blow air out of your nose.  Keep all follow-up visits as told by your health care  provider. This is important. Contact a health care provider if:  Your symptoms do not go away after treatment.  Your symptoms come back after treatment.  You are unable to pop your ears.  You have: ? A fever. ? Pain in your ear. ? Pain in your head or neck. ? Fluid draining from your ear.  Your hearing suddenly changes.  You become very dizzy.  You lose your balance. Summary  Eustachian tube dysfunction refers to a condition in which a blockage develops in the eustachian tube.  It can be caused by ear infections, allergies, inhaled irritants, or abnormal growths in the nose or throat.  Symptoms include ear pain, hearing loss, or ringing in the ears.  Mild cases are treated with maneuvers to unblock the ears, such as yawning or ear popping.  Severe cases are treated with medicines. Surgery may also be done (rare). This information is not intended to replace advice given to you by your health care provider. Make sure you discuss any questions you have with your health care provider. Document Revised: 06/22/2017 Document Reviewed: 06/22/2017 Elsevier Patient Education  [place].

## 2020-06-04 NOTE — Progress Notes (Signed)
   Subjective:    Patient ID: John York, male    DOB: Mar 18, 1960, 60 y.o.   MRN: [id number]  HPI  60 yo male in non acute distress. Started Saturday with symptoms pressure in head, post nasal drip, no HA, scratchy yesterday better today, nasal congestion. Started Allegra yesterday. Has been doing Flonase.  Mild runny nose,  Clear. No cough to speak of...sneezing more.Denies itchy eyes.  Returned to [place] last Wednesday from his travels visiting family in [place].   Blood pressure 128/80, pulse 86, temperature 98.2 F (36.8 C), temperature source Oral, resp. rate 16, weight 298 lb (135.2 kg), SpO2 98 %.  No Known Allergies  Review of Systems  Constitutional: Negative for chills and fever.  HENT: Positive for sneezing, sore throat (scratchy throat  ) and voice change (deeper). Negative for ear discharge.   Respiratory: Positive for cough (due to drainage). Negative for chest tightness, shortness of breath and wheezing.   Cardiovascular: Negative for chest pain.  Gastrointestinal: Negative for abdominal pain, diarrhea, nausea and vomiting.  Skin: Negative for color change.  Allergic/Immunologic: Positive for environmental allergies.  Neurological: Negative for dizziness, syncope, light-headedness and headaches.   Vaccinated with Coca-Cola and boosted    Objective:   Physical Exam Vitals and nursing note reviewed.  Constitutional:      Appearance: Normal appearance. He is obese.  HENT:     Head: Normocephalic and atraumatic.     Right Ear: Hearing, ear canal and external ear normal. A middle ear effusion is present.     Left Ear: Hearing, ear canal and external ear normal. A middle ear effusion is present.     Nose: Congestion and rhinorrhea (clear) present.     Mouth/Throat:     Mouth: Mucous membranes are moist.     Pharynx: Oropharynx is clear.  Eyes:     Extraocular Movements: Extraocular movements intact.     Conjunctiva/sclera: Conjunctivae normal.     Pupils: Pupils  are equal, round, and reactive to light.  Cardiovascular:     Rate and Rhythm: Normal rate and regular rhythm.     Heart sounds: Normal heart sounds.  Pulmonary:     Effort: Pulmonary effort is normal.     Breath sounds: Normal breath sounds.  Musculoskeletal:        General: Normal range of motion.  Skin:    General: Skin is warm and dry.  Neurological:     General: No focal deficit present.     Mental Status: He is alert and oriented to person, place, and time. Mental status is at baseline.  Psychiatric:        Mood and Affect: Mood normal.        Behavior: Behavior normal.        Thought Content: Thought content normal.        Judgment: Judgment normal.      Covid-19 POC test negative.     Assessment & Plan:  Seasonal Allergic Rhinnitis Post nasal drip Eustachian tube dysfunction Bilaterally. Continue to take Allegra and Flonase per package instructions. Saline nasal spray 3 times a day. May wear mask when outside.  Take OTC plain Mucinex for congestion take per package instructions. Call if color change to mucous, fever or chills, for possible antibiotic treatment.Return to clinic as needed. Patient verbalizes understanding and has no questions at discharge.

## 2020-06-06 DIAGNOSIS — G4733 Obstructive sleep apnea (adult) (pediatric): Secondary | ICD-10-CM | POA: Diagnosis not present

## 2020-07-08 ENCOUNTER — Telehealth: Payer: Self-pay | Admitting: Internal Medicine

## 2020-07-08 DIAGNOSIS — H6983 Other specified disorders of Eustachian tube, bilateral: Secondary | ICD-10-CM | POA: Diagnosis not present

## 2020-07-08 DIAGNOSIS — H9042 Sensorineural hearing loss, unilateral, left ear, with unrestricted hearing on the contralateral side: Secondary | ICD-10-CM | POA: Diagnosis not present

## 2020-07-08 DIAGNOSIS — J301 Allergic rhinitis due to pollen: Secondary | ICD-10-CM | POA: Diagnosis not present

## 2020-07-08 DIAGNOSIS — H9312 Tinnitus, left ear: Secondary | ICD-10-CM | POA: Diagnosis not present

## 2020-07-08 NOTE — Telephone Encounter (Signed)
Called pt to schedule an appt. Pt needs to call us back to schedule due to being at another appt.

## 2020-07-08 NOTE — Telephone Encounter (Signed)
Medication Refill - Medication: metFORMIN (GLUCOPHAGE) 1000 MG tablet (patient will call back to schedule follow up appointment   Has the patient contacted their pharmacy? Yes.     (Agent: If yes, when and what did the pharmacy advise?) Contact PCP office  Preferred Pharmacy (with phone number or street name):  [id number] - [place], [place] Phone:  [phone]  Fax:  [phone]       Agent: Please be advised that RX refills may take up to 3 business days. We ask that you follow-up with your pharmacy.

## 2020-07-16 ENCOUNTER — Encounter: Payer: Self-pay | Admitting: Family Medicine

## 2020-07-16 ENCOUNTER — Ambulatory Visit: Payer: BC Managed Care – PPO | Admitting: Family Medicine

## 2020-07-16 ENCOUNTER — Other Ambulatory Visit: Payer: Self-pay

## 2020-07-16 VITALS — BP 136/88 | HR 71 | Temp 98.2°F | Resp 16 | Ht 69.0 in | Wt 299.0 lb

## 2020-07-16 DIAGNOSIS — G4733 Obstructive sleep apnea (adult) (pediatric): Secondary | ICD-10-CM | POA: Diagnosis not present

## 2020-07-16 DIAGNOSIS — E1169 Type 2 diabetes mellitus with other specified complication: Secondary | ICD-10-CM

## 2020-07-16 DIAGNOSIS — E785 Hyperlipidemia, unspecified: Secondary | ICD-10-CM

## 2020-07-16 DIAGNOSIS — B351 Tinea unguium: Secondary | ICD-10-CM

## 2020-07-16 DIAGNOSIS — E119 Type 2 diabetes mellitus without complications: Secondary | ICD-10-CM

## 2020-07-16 DIAGNOSIS — Z23 Encounter for immunization: Secondary | ICD-10-CM | POA: Diagnosis not present

## 2020-07-16 DIAGNOSIS — K5900 Constipation, unspecified: Secondary | ICD-10-CM

## 2020-07-16 LAB — POCT GLYCOSYLATED HEMOGLOBIN (HGB A1C): Hemoglobin A1C: 6.2 % — AB (ref 4.0–5.6)

## 2020-07-16 MED ORDER — ROSUVASTATIN CALCIUM 10 MG PO TABS
10.0000 mg | ORAL_TABLET | Freq: Every day | ORAL | 1 refills | Status: DC
Start: 2020-07-16 — End: 2020-11-15

## 2020-07-16 MED ORDER — METFORMIN HCL 1000 MG PO TABS: 1000.0000 mg | ORAL_TABLET | Freq: Every day | ORAL | 1 refills | Status: DC

## 2020-07-16 NOTE — Progress Notes (Signed)
Name: John York   MRN: [id number]    DOB: 12/31/1960   Date:07/16/2020       Progress Note  Subjective  Chief Complaint  Medication Refill  HPI  OSA: seen by  Dr. Kasa in the past and is compliant with CPAP. He tries to sleep at least 5-6 hours per night   Morbid Obesity:BMI above 40. He broke the 300 lbs.  He has been doing the elliptical, he will try to increase gym frequency, does not want to change from Metformin to a GLP1 agonist at this time.He eats a balanced diet, eating smaller portions  Constipation: stable, he used to take Metamucil in the past, but has been drinking a home made smoothie for breakfast and also drinking more water and seems to control symptoms. Bowel movements are now [place] 3-4, bowel movements about once a day  Tinnitus: he saw Dr. Bennett last week and hearing was normal, improved since previous hearing test but tinnitus is a little louder on the left side, and if does not improved he will have MRI  Diabetes mellitustype 2: he was diagnosed about 5 years ago, initially diet controlled, he started Metformin last Fall and has been doing well, A1C has improved down from 6.7 % to 6.2 %, he has been walking after dinner, no side effects of medication. Last urine micro , LDL was at goal.  Dyslipidemia:he is taking Crestor and denies myalgia , LDL was 47   Tinea Pedis: Seeing Dr. Evans , podiatrist, took oral medication, and resolved, still has onychomycosis   Low Libido: He notes low libido, having some additional fat development in the arms.  Denies fatigue.  Reviewed last labs     Patient Active Problem List   Diagnosis Date Noted  . Constipation 12/16/2018  . Tinea pedis of both feet 12/16/2018  . Seasonal allergic rhinitis 05/30/2018  . Type 2 diabetes mellitus without complication, without long-term current use of insulin (HCC) 02/08/2018  . Hyperlipidemia associated with type 2 diabetes mellitus (HCC) 02/08/2018  . Vertigo  02/07/2018  . OSA (obstructive sleep apnea) 02/07/2018  . Tinnitus 06/16/2017  . Class 3 severe obesity due to excess calories with serious comorbidity and body mass index (BMI) of 40.0 to 44.9 in adult (HCC) 09/25/2014  . FHx: migraine headaches 09/25/2014  . Other specified joint disorders, unspecified shoulder 04/16/2014  . 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 Family history of malignant neoplasm of gastrointestinal tract   . Personal history of colonic polyps     Past Surgical History:  Procedure Laterality Date  . COLONOSCOPY  02/03/2012   Dr. Sankar  . COLONOSCOPY W/ POLYPECTOMY  2008   7mm size sessile non bleeding polyp was found in the sigmoid colon  . COLONOSCOPY WITH PROPOFOL N/A 02/24/2017   Procedure: COLONOSCOPY WITH PROPOFOL;  Surgeon: Sankar, Seeplaputhur G, MD;  Location: [facility] ENDOSCOPY;  Service: Endoscopy;  Laterality: N/A;    Family History  Problem Relation Age of Onset  . Cancer Sister        breast  . Colon cancer Brother   . Diabetes Father   . Diabetes Mother   . Hypertension Mother     Social History   Tobacco Use  . Smoking status: Never Smoker  . Smokeless tobacco: Never Used  Substance Use Topics  . Alcohol use: No    Alcohol/week: 0.0 standard drinks     Current Outpatient Medications:  .  Blood Glucose Monitoring Suppl (CONTOUR NEXT ONE) KIT, 1 each by Does   not apply route daily. Please check blood sugar once daily.  May check 1 additional time per day as needed., Disp: 1 kit, Rfl: 0 .  cholecalciferol (VITAMIN D) 1000 units tablet, Take 1,000 Units daily by mouth., Disp: , Rfl:  .  fexofenadine (ALLEGRA ALLERGY) 180 MG tablet, Take 1 tablet (180 mg total) by mouth daily., Disp: 90 tablet, Rfl: 1 .  fluticasone (FLONASE) 50 MCG/ACT nasal spray, Place 2 sprays into both nostrils daily., Disp: 16 g, Rfl: 6 .  glucose blood test strip, Use as instructed, Disp: 100 each, Rfl: 12 .  metFORMIN (GLUCOPHAGE) 1000 MG tablet, Take 1 tablet (1,000 mg  total) by mouth daily with breakfast., Disp: 90 tablet, Rfl: 0 .  rosuvastatin (CRESTOR) 10 MG tablet, Take 1 tablet (10 mg total) by mouth daily., Disp: 90 tablet, Rfl: 3  No Known Allergies  I personally reviewed active problem list, medication list, allergies, family history, social history, health maintenance with the patient/caregiver today.   ROS  Constitutional: Negative for fever or weight change.  Respiratory: Negative for cough and shortness of breath.   Cardiovascular: Negative for chest pain or palpitations.  Gastrointestinal: Negative for abdominal pain, no bowel changes.  Musculoskeletal: Negative for gait problem or joint swelling.  Skin: Negative for rash.  Neurological: Negative for dizziness or headache.  No other specific complaints in a complete review of systems (except as listed in HPI above).  Objective  Vitals:   07/16/20 0931  BP: 136/88  Pulse: 71  Resp: 16  Temp: 98.2 F (36.8 C)  TempSrc: Oral  SpO2: 98%  Weight: 299 lb (135.6 kg)  Height: 5' 9" (1.753 m)    Body mass index is 44.15 kg/m.  Physical Exam  Constitutional: Patient appears well-developed and well-nourished. Obese No distress.  HEENT: head atraumatic, normocephalic, pupils equal and reactive to light, neck supple Cardiovascular: Normal rate, regular rhythm and normal heart sounds.  No murmur heard. Trace BLE edema. Pulmonary/Chest: Effort normal and breath sounds normal. No respiratory distress. Abdominal: Soft.  There is no tenderness. Psychiatric: Patient has a normal mood and affect. behavior is normal. Judgment and thought content normal.  Recent Results (from the past 2160 hour(s))  POC COVID-19     Status: Normal   Collection Time: 06/04/20 10:42 AM  Result Value Ref Range   SARS Coronavirus 2 Ag Negative Negative    Comment: Patient vaccinated. Patient aware of negative result. Follow up in office with H. Ratcliffe  POCT HgB A1C     Status: Abnormal   Collection Time:  07/16/20  9:35 AM  Result Value Ref Range   Hemoglobin A1C 6.2 (A) 4.0 - 5.6 %   HbA1c POC (<> result, manual entry)     HbA1c, POC (prediabetic range)     HbA1c, POC (controlled diabetic range)      Diabetic Foot Exam: Diabetic Foot Exam - Simple   Simple Foot Form Diabetic Foot exam was performed with the following findings: Yes 07/16/2020  9:56 AM  Visual Inspection See comments: Yes Sensation Testing Intact to touch and monofilament testing bilaterally: Yes Pulse Check Posterior Tibialis and Dorsalis pulse intact bilaterally: Yes Comments Thick and brittle first toes bilaterally       PHQ2/9: Depression screen [facility] 2/9 07/16/2020 04/04/2020 07/27/2019 12/16/2018 08/30/2018  Decreased Interest 0 0 0 0 1  Down, Depressed, Hopeless 0 0 0 0 0  PHQ - 2 Score 0 0 0 0 1  Altered sleeping - - 0 0 0  Tired, decreased energy - - 0 0 1  Change in appetite - - 0 0 0  Feeling bad or failure about yourself  - - 0 0 0  Trouble concentrating - - 0 0 0  Moving slowly or fidgety/restless - - 0 0 0  Suicidal thoughts - - 0 0 0  PHQ-9 Score - - 0 0 2  Difficult doing work/chores - - Not difficult at all - Not difficult at all    phq 9 is negative   Fall Risk: Fall Risk  07/16/2020 04/04/2020 07/27/2019 12/16/2018 08/30/2018  Falls in the past year? 0 0 0 0 0  Comment - - - - -  Number falls in past yr: 0 0 0 0 0  Injury with Fall? 0 0 0 0 0  Follow up - - - - -     Functional Status Survey: Is the patient deaf or have difficulty hearing?: No Does the patient have difficulty seeing, even when wearing glasses/contacts?: No Does the patient have difficulty concentrating, remembering, or making decisions?: No Does the patient have difficulty walking or climbing stairs?: No Does the patient have difficulty dressing or bathing?: No Does the patient have difficulty doing errands alone such as visiting a doctor's office or shopping?: No    Assessment & Plan   1. Dyslipidemia associated with  type 2 diabetes mellitus (HCC)  - POCT HgB A1C  2. OSA (obstructive sleep apnea)   3. Morbid obesity (HCC)  Discussed with the patient the risk posed by an increased BMI. Discussed importance of portion control, calorie counting and at least 150 minutes of physical activity weekly. Avoid sweet beverages and drink more water. Eat at least 6 servings of fruit and vegetables daily   4. Constipation, unspecified constipation type  Doing well   5. Need for shingles vaccine  Today   6. Onychomycosis  Consider going back on medication  

## 2020-09-19 DIAGNOSIS — G4733 Obstructive sleep apnea (adult) (pediatric): Secondary | ICD-10-CM | POA: Diagnosis not present

## 2020-11-14 NOTE — Progress Notes (Signed)
Name: John York   MRN: [id number]    DOB: 1960-10-07   Date:11/15/2020       Progress Note  Subjective  Chief Complaint  Follow Up  HPI  OSA: seen by  Dr. Mortimer Fries in the past and is compliant with CPAP. He tries to sleep at least 4 hours per night    Morbid Obesity: BMI above 40. He broke the 300 lbs.  He has been doing the elliptical at least twice a week for 30 minutes. He eats a balanced diet, eating smaller portions . He states he will try to cut down on bread    Constipation:. Bowel movements are now mostly [place]  4, bowel movements about once a day, he has increase in gas at times, but not abdominal pain    Tinnitus: he saw Dr. Richardson Landry back in May, symptoms are stable, but if gets worse he states Dr. Richardson Landry will order an MRI, worse on the left side He has occasional vertigo    Diabetes mellitus type 2 : he was diagnosed about 5 years ago, initially diet controlled, he started Metformin last Fall 2021  and has been doing well, A1C has improved down from 6.7 % to 6.2 % today is stable at 6.3 %. Last urine micro and also LDL at goal.    Dyslipidemia: he is taking Crestor and denies myalgia , LDL was 47 . We will recheck labs yearly      Patient Active Problem List   Diagnosis Date Noted   Constipation 12/16/2018   Tinea pedis of both feet 12/16/2018   Seasonal allergic rhinitis 05/30/2018   Type 2 diabetes mellitus without complication, without long-term current use of insulin ([place]) 02/08/2018   Hyperlipidemia associated with type 2 diabetes mellitus ([place]) 02/08/2018   Vertigo 02/07/2018   OSA (obstructive sleep apnea) 02/07/2018   Tinnitus 06/16/2017   Class 3 severe obesity due to excess calories with serious comorbidity and body mass index (BMI) of 40.0 to 44.9 in adult ([place]) 09/25/2014   FHx: migraine headaches 09/25/2014   Other specified joint disorders, unspecified shoulder 04/16/2014   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 Jamal Collin   COLONOSCOPY W/ POLYPECTOMY  2008   65m size sessile non bleeding polyp was found in the sigmoid colon   COLONOSCOPY WITH PROPOFOL N/A 02/24/2017   Procedure: COLONOSCOPY WITH PROPOFOL;  Surgeon: SChristene Lye MD;  Location: [facility] ENDOSCOPY;  Service: Endoscopy;  Laterality: N/A;    Family History  Problem Relation Age of Onset   Cancer Sister        breast   Colon cancer Brother    Diabetes Father    Diabetes Mother    Hypertension Mother     Social History   Tobacco Use   Smoking status: Never   Smokeless tobacco: Never  Substance Use Topics   Alcohol use: No    Alcohol/week: 0.0 standard drinks     Current Outpatient Medications:    Blood Glucose Monitoring Suppl (CONTOUR NEXT ONE) KIT, 1 each by Does not apply route daily. Please check blood sugar once daily.  May check 1 additional time per day as needed., Disp: 1 kit, Rfl: 0   cholecalciferol (VITAMIN D) 1000 units tablet, Take 1,000 Units daily by mouth., Disp: , Rfl:  fexofenadine (ALLEGRA ALLERGY) 180 MG tablet, Take 1 tablet (180 mg total) by mouth daily., Disp: 90 tablet, Rfl: 1   fluticasone (FLONASE) 50 MCG/ACT nasal spray, Place 2 sprays into both nostrils daily., Disp: 16 g, Rfl: 6   glucose blood test strip, Use as instructed, Disp: 100 each, Rfl: 12   metFORMIN (GLUCOPHAGE) 1000 MG tablet, Take 1 tablet (1,000 mg total) by mouth daily with breakfast., Disp: 90 tablet, Rfl: 1   rosuvastatin (CRESTOR) 10 MG tablet, Take 1 tablet (10 mg total) by mouth daily., Disp: 90 tablet, Rfl: 1  No Known Allergies  I personally reviewed active problem list, medication list, allergies, family history, social history, health maintenance with the patient/caregiver today.   ROS  Constitutional: Negative for fever or weight change.  Respiratory: Negative  for cough and shortness of breath.   Cardiovascular: Negative for chest pain or palpitations.  Gastrointestinal: Negative for abdominal pain, no bowel changes.  Musculoskeletal: Negative for gait problem or joint swelling.  Skin: Negative for rash.  Neurological: Negative for dizziness or headache.  No other specific complaints in a complete review of systems (except as listed in HPI above).   Objective  Vitals:   11/15/20 0827  BP: 132/86  Pulse: 71  Resp: 16  Temp: 98.3 F (36.8 C)  SpO2: 98%  Weight: 297 lb (134.7 kg)  Height: 5' 9"  (1.753 m)    Body mass index is 43.86 kg/m.  Physical Exam  Constitutional: Patient appears well-developed and well-nourished. Obese  No distress.  HEENT: head atraumatic, normocephalic, pupils equal and reactive to light, neck supple Cardiovascular: Normal rate, regular rhythm and normal heart sounds.  No murmur heard. No BLE edema. Pulmonary/Chest: Effort normal and breath sounds normal. No respiratory distress. Abdominal: Soft.  There is no tenderness. Psychiatric: Patient has a normal mood and affect. behavior is normal. Judgment and thought content normal.   Recent Results (from the past 2160 hour(s))  POCT HgB A1C     Status: Abnormal   Collection Time: 11/15/20  8:37 AM  Result Value Ref Range   Hemoglobin A1C 6.3 (A) 4.0 - 5.6 %   HbA1c POC (<> result, manual entry)     HbA1c, POC (prediabetic range)     HbA1c, POC (controlled diabetic range)       PHQ2/9: Depression screen [facility] 2/9 11/15/2020 07/16/2020 04/04/2020 07/27/2019 12/16/2018  Decreased Interest 0 0 0 0 0  Down, Depressed, Hopeless 0 0 0 0 0  PHQ - 2 Score 0 0 0 0 0  Altered sleeping - - - 0 0  Tired, decreased energy - - - 0 0  Change in appetite - - - 0 0  Feeling bad or failure about yourself  - - - 0 0  Trouble concentrating - - - 0 0  Moving slowly or fidgety/restless - - - 0 0  Suicidal thoughts - - - 0 0  PHQ-9 Score - - - 0 0  Difficult doing work/chores - -  - Not difficult at all -    phq 9 is negative   Fall Risk: Fall Risk  11/15/2020 07/16/2020 04/04/2020 07/27/2019 12/16/2018  Falls in the past year? 0 0 0 0 0  Comment - - - - -  Number falls in past yr: 0 0 0 0 0  Injury with Fall? 0 0 0 0 0  Risk for fall due to : No Fall Risks - - - -  Follow up Falls prevention discussed - - - -  Functional Status Survey: Is the patient deaf or have difficulty hearing?: No Does the patient have difficulty seeing, even when wearing glasses/contacts?: No Does the patient have difficulty concentrating, remembering, or making decisions?: No Does the patient have difficulty walking or climbing stairs?: No Does the patient have difficulty dressing or bathing?: No Does the patient have difficulty doing errands alone such as visiting a doctor's office or shopping?: No    Assessment & Plan  1. Dyslipidemia associated with type 2 diabetes mellitus (HCC)  - POCT HgB A1C - metFORMIN (GLUCOPHAGE) 1000 MG tablet; Take 1 tablet (1,000 mg total) by mouth daily with breakfast.  Dispense: 90 tablet; Refill: 1 - rosuvastatin (CRESTOR) 10 MG tablet; Take 1 tablet (10 mg total) by mouth daily.  Dispense: 90 tablet; Refill: 1  2. Need for immunization against influenza  - Flu Vaccine QUAD 28moIM (Fluarix, Fluzone & Alfiuria Quad PF)  3. OSA (obstructive sleep apnea)   4. Morbid obesity ([place]  Discussed with the patient the risk posed by an increased BMI. Discussed importance of portion control, calorie counting and at least 150 minutes of physical activity weekly. Avoid sweet beverages and drink more water. Eat at least 6 servings of fruit and vegetables daily    5. Constipation, unspecified constipation type   6. Diabetes mellitus type 2 in obese (HCC)  - metFORMIN (GLUCOPHAGE) 1000 MG tablet; Take 1 tablet (1,000 mg total) by mouth daily with breakfast.  Dispense: 90 tablet; Refill: 1   7. Need for shingles vaccine  - Varicella-zoster vaccine IM

## 2020-11-15 ENCOUNTER — Ambulatory Visit: Payer: BC Managed Care – PPO | Admitting: Family Medicine

## 2020-11-15 ENCOUNTER — Other Ambulatory Visit: Payer: Self-pay

## 2020-11-15 ENCOUNTER — Encounter: Payer: Self-pay | Admitting: Family Medicine

## 2020-11-15 VITALS — BP 132/86 | HR 71 | Temp 98.3°F | Resp 16 | Ht 69.0 in | Wt 297.0 lb

## 2020-11-15 DIAGNOSIS — E1169 Type 2 diabetes mellitus with other specified complication: Secondary | ICD-10-CM | POA: Diagnosis not present

## 2020-11-15 DIAGNOSIS — Z23 Encounter for immunization: Secondary | ICD-10-CM | POA: Diagnosis not present

## 2020-11-15 DIAGNOSIS — G4733 Obstructive sleep apnea (adult) (pediatric): Secondary | ICD-10-CM | POA: Diagnosis not present

## 2020-11-15 DIAGNOSIS — E785 Hyperlipidemia, unspecified: Secondary | ICD-10-CM | POA: Diagnosis not present

## 2020-11-15 DIAGNOSIS — K5900 Constipation, unspecified: Secondary | ICD-10-CM

## 2020-11-15 DIAGNOSIS — E669 Obesity, unspecified: Secondary | ICD-10-CM

## 2020-11-15 LAB — POCT GLYCOSYLATED HEMOGLOBIN (HGB A1C): Hemoglobin A1C: 6.3 % — AB (ref 4.0–5.6)

## 2020-11-15 MED ORDER — METFORMIN HCL 1000 MG PO TABS: 1000.0000 mg | ORAL_TABLET | Freq: Every day | ORAL | 1 refills | Status: DC

## 2020-11-15 MED ORDER — ROSUVASTATIN CALCIUM 10 MG PO TABS: 10.0000 mg | ORAL_TABLET | Freq: Every day | ORAL | 1 refills | Status: DC

## 2020-12-06 DIAGNOSIS — Z23 Encounter for immunization: Secondary | ICD-10-CM | POA: Diagnosis not present

## 2021-01-07 DIAGNOSIS — G4733 Obstructive sleep apnea (adult) (pediatric): Secondary | ICD-10-CM | POA: Diagnosis not present

## 2021-02-07 DIAGNOSIS — G4733 Obstructive sleep apnea (adult) (pediatric): Secondary | ICD-10-CM | POA: Diagnosis not present

## 2021-03-09 DIAGNOSIS — G4733 Obstructive sleep apnea (adult) (pediatric): Secondary | ICD-10-CM | POA: Diagnosis not present

## 2021-03-27 ENCOUNTER — Ambulatory Visit: Payer: BC Managed Care – PPO | Admitting: Family Medicine

## 2021-03-31 NOTE — Progress Notes (Signed)
Name: John York   MRN: [id number]    DOB: [birth year]   Date:04/01/2021       Progress Note  Subjective  Chief Complaint  Follow Up  HPI  OSA: seen by  Dr. Mortimer Fries in the past and is compliant with CPAP. He tries to sleep at least 4 hours per night . Denies morning headache and feels rested    Morbid Obesity: BMI above 40. He broke the 300 lbs.  He has been doing the elliptical at least twice a week for 30 minutes. He is trying to eat healthy. He was walking more after meals but since foot has been bothering him not as active, advised to go back to the Elliptical    Constipation:. Bowel movements are now mostly [place]  4 but has noticed some [place] 1 lately. He has been trying Metamucil with some improvement. Advised fluids, fiber and physical activity, can take Miralax otc    Tinnitus: he saw Dr. Richardson Landry back in May 22 , symptoms are stable, but if gets worse he states Dr. Richardson Landry will order an MRI, worse on the left side He has occasional vertigo . Stable.    Diabetes mellitus type 2 : he was diagnosed about 5 years ago, initially diet controlled, he started Metformin last Fall 2021  and has been doing well, A1C has improved down from 6.7 % to 6.2 % , 6.3 % and today is 6.4 %  Last urine micro and also LDL at goal but he is due for repeat labs We will check B12. He has noticed nocturia once per night over the past month. No dysuria or hematuria. Discussed avoiding drinking before bed time and we will check PSA.    Dyslipidemia: he is taking Crestor and denies myalgia , LDL was 47 . We will recheck labs today    Left foot pain: he noticed left forefoot pain ( ball of the left foot) over the past month. Aggravated by wearing dress shoes with hard soles or walking bare foot. He recently bought new [employer] to see if it will help.   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 Jamal Collin   COLONOSCOPY W/ POLYPECTOMY  2008   28m size sessile non bleeding polyp was found in the sigmoid colon   COLONOSCOPY WITH PROPOFOL N/A 02/24/2017   Procedure: COLONOSCOPY WITH PROPOFOL;  Surgeon: SChristene Lye MD;  Location: [facility] ENDOSCOPY;  Service: Endoscopy;  Laterality: N/A;    Family History  Problem Relation Age of Onset   Cancer Sister        breast   Colon cancer Brother    Diabetes Father    Diabetes Mother    Hypertension Mother     Social History   Tobacco Use   Smoking status: Never  Smokeless tobacco: Never  Substance Use Topics   Alcohol use: No    Alcohol/week: 0.0 standard drinks     Current Outpatient Medications:    Blood Glucose Monitoring Suppl (CONTOUR NEXT ONE) KIT, 1 each by Does not apply route daily. Please check blood sugar once daily.  May check 1 additional time per day as needed., Disp: 1 kit, Rfl: 0   cholecalciferol (VITAMIN D) 1000 units tablet, Take 1,000 Units daily by mouth., Disp: , Rfl:    fluticasone (FLONASE) 50 MCG/ACT nasal spray, Place 2 sprays into both nostrils daily., Disp: 16 g, Rfl: 6   glucose blood test strip, Use as instructed, Disp: 100 each, Rfl: 12   metFORMIN (GLUCOPHAGE) 1000 MG tablet, Take 1 tablet (1,000 mg total) by mouth daily with breakfast., Disp: 90 tablet, Rfl: 1   rosuvastatin (CRESTOR) 10 MG tablet, Take 1 tablet  (10 mg total) by mouth daily., Disp: 90 tablet, Rfl: 1   fexofenadine (ALLEGRA ALLERGY) 180 MG tablet, Take 1 tablet (180 mg total) by mouth daily. (Patient not taking: Reported on 04/01/2021), Disp: 90 tablet, Rfl: 1  No Known Allergies  I personally reviewed active problem list, medication list, allergies, family history, social history, health maintenance with the patient/caregiver today.   ROS  Constitutional: Negative for fever or weight change.  Respiratory: Negative for cough and shortness of breath.   Cardiovascular: Negative for chest pain or palpitations.  Gastrointestinal: Negative for abdominal pain, no bowel changes.  Musculoskeletal: positive for gait problem but no  joint swelling.  Skin: Negative for rash.  Neurological: Negative for dizziness or headache.  No other specific complaints in a complete review of systems (except as listed in HPI above).   Objective  Vitals:   04/01/21 1038 04/01/21 1045  BP: (!) 144/78 136/84  Pulse: 75   Resp: 16   Temp: 98.3 F (36.8 C)   SpO2: 98%   Weight: 297 lb (134.7 kg)   Height: 5' 9"  (1.753 m)     Body mass index is 43.86 kg/m.  Physical Exam  Constitutional: Patient appears well-developed and well-nourished. Obese  No distress.  HEENT: head atraumatic, normocephalic, pupils equal and reactive to light,  neck supple, Cardiovascular: Normal rate, regular rhythm and normal heart sounds.  No murmur heard. No BLE edema. Pulmonary/Chest: Effort normal and breath sounds normal. No respiratory distress. Abdominal: Soft.  There is no tenderness. Muscular Skeletal: mild pain during palpation of left mid forefoot , thick toe nails. Psychiatric: Patient has a normal mood and affect. behavior is normal. Judgment and thought content normal.   Recent Results (from the past 2160 hour(s))  POCT HgB A1C     Status: Abnormal   Collection Time: 04/01/21 10:46 AM  Result Value Ref Range   Hemoglobin A1C 6.4 (A) 4.0 - 5.6 %   HbA1c  POC (<> result, manual entry)     HbA1c, POC (prediabetic range)     HbA1c, POC (controlled diabetic range)       PHQ2/9: Depression screen [facility] 2/9 04/01/2021 11/15/2020 07/16/2020 04/04/2020 07/27/2019  Decreased Interest 0 0 0 0 0  Down, Depressed, Hopeless 0 0 0 0 0  PHQ - 2 Score 0 0 0 0 0  Altered sleeping 0 - - - 0  Tired, decreased energy 0 - - - 0  Change in appetite 0 - - - 0  Feeling bad or failure about yourself  0 - - - 0  Trouble concentrating 0 - - - 0  Moving slowly or fidgety/restless 0 - - - 0  Suicidal thoughts 0 - - - 0  PHQ-9 Score 0 - - - 0  Difficult doing work/chores - - - - Not difficult at all    phq 9 is negative   Fall Risk: Fall Risk  04/01/2021 11/15/2020 07/16/2020 04/04/2020 07/27/2019  Falls in the past year? 0 0 0 0 0  Comment - - - - -  Number falls in past yr: 0 0 0 0 0  Injury with Fall? 0 0 0 0 0  Risk for fall due to : No Fall Risks No Fall Risks - - -  Follow up Falls prevention discussed Falls prevention discussed - - -      Functional Status Survey: Is the patient deaf or have difficulty hearing?: No Does the patient have difficulty seeing, even when wearing glasses/contacts?: No Does the patient have difficulty concentrating, remembering, or making decisions?: No Does the patient have difficulty walking or climbing stairs?: No Does the patient have difficulty dressing or bathing?: No Does the patient have difficulty doing errands alone such as visiting a doctor's office or shopping?: No    Assessment & Plan  1. Dyslipidemia associated with type 2 diabetes mellitus (HCC)  - POCT HgB A1C - Microalbumin / creatinine urine ratio - Lipid panel - metFORMIN (GLUCOPHAGE) 1000 MG tablet; Take 1 tablet (1,000 mg total) by mouth daily with breakfast.  Dispense: 90 tablet; Refill: 1 - rosuvastatin (CRESTOR) 10 MG tablet; Take 1 tablet (10 mg total) by mouth daily.  Dispense: 90 tablet; Refill: 1  2. Morbid obesity ([place])  Discussed with the  patient the risk posed by an increased BMI. Discussed importance of portion control, calorie counting and at least 150 minutes of physical activity weekly. Avoid sweet beverages and drink more water. Eat at least 6 servings of fruit and vegetables daily    3. OSA (obstructive sleep apnea)   4. Diabetes mellitus type 2 in obese (HCC)  - metFORMIN (GLUCOPHAGE) 1000 MG tablet; Take 1 tablet (1,000 mg total) by mouth daily with breakfast.  Dispense: 90 tablet; Refill: 1  5. Hyperlipidemia associated with type 2 diabetes mellitus ([place])   6. Screening for prostate cancer  - PSA  7. Long-term use of high-risk medication  - CBC with Differential/Platelet - B12 - Comprehensive metabolic panel  8. Left foot pain  Discussed better shoe wear

## 2021-04-01 ENCOUNTER — Encounter: Payer: Self-pay | Admitting: Family Medicine

## 2021-04-01 ENCOUNTER — Ambulatory Visit: Payer: BC Managed Care – PPO | Admitting: Family Medicine

## 2021-04-01 VITALS — BP 136/84 | HR 75 | Temp 98.3°F | Resp 16 | Ht 69.0 in | Wt 297.0 lb

## 2021-04-01 DIAGNOSIS — Z79899 Other long term (current) drug therapy: Secondary | ICD-10-CM

## 2021-04-01 DIAGNOSIS — E1169 Type 2 diabetes mellitus with other specified complication: Secondary | ICD-10-CM

## 2021-04-01 DIAGNOSIS — Z125 Encounter for screening for malignant neoplasm of prostate: Secondary | ICD-10-CM | POA: Diagnosis not present

## 2021-04-01 DIAGNOSIS — E785 Hyperlipidemia, unspecified: Secondary | ICD-10-CM | POA: Diagnosis not present

## 2021-04-01 DIAGNOSIS — M79672 Pain in left foot: Secondary | ICD-10-CM

## 2021-04-01 DIAGNOSIS — E669 Obesity, unspecified: Secondary | ICD-10-CM

## 2021-04-01 DIAGNOSIS — G4733 Obstructive sleep apnea (adult) (pediatric): Secondary | ICD-10-CM

## 2021-04-01 LAB — POCT GLYCOSYLATED HEMOGLOBIN (HGB A1C): Hemoglobin A1C: 6.4 % — AB (ref 4.0–5.6)

## 2021-04-01 MED ORDER — METFORMIN HCL 1000 MG PO TABS: 1000.0000 mg | ORAL_TABLET | Freq: Every day | ORAL | 1 refills | Status: DC

## 2021-04-01 MED ORDER — ROSUVASTATIN CALCIUM 10 MG PO TABS: 10.0000 mg | ORAL_TABLET | Freq: Every day | ORAL | 1 refills | Status: DC

## 2021-04-04 ENCOUNTER — Other Ambulatory Visit: Payer: Self-pay

## 2021-04-04 ENCOUNTER — Other Ambulatory Visit: Payer: BC Managed Care – PPO

## 2021-04-04 DIAGNOSIS — E1169 Type 2 diabetes mellitus with other specified complication: Secondary | ICD-10-CM

## 2021-04-04 DIAGNOSIS — E785 Hyperlipidemia, unspecified: Secondary | ICD-10-CM

## 2021-04-04 DIAGNOSIS — Z79899 Other long term (current) drug therapy: Secondary | ICD-10-CM

## 2021-04-04 DIAGNOSIS — Z125 Encounter for screening for malignant neoplasm of prostate: Secondary | ICD-10-CM

## 2021-04-05 LAB — COMPREHENSIVE METABOLIC PANEL
ALT: 22 IU/L (ref 0–44)
AST: 28 IU/L (ref 0–40)
Albumin/Globulin Ratio: 1.7 (ref 1.2–2.2)
Albumin: 4.8 g/dL (ref 3.8–4.8)
Alkaline Phosphatase: 113 IU/L (ref 44–121)
BUN/Creatinine Ratio: 13 (ref 10–24)
BUN: 16 mg/dL (ref 8–27)
Bilirubin Total: 0.7 mg/dL (ref 0.0–1.2)
CO2: 24 mmol/L (ref 20–29)
Calcium: 9.8 mg/dL (ref 8.6–10.2)
Chloride: 102 mmol/L (ref 96–106)
Creatinine, Ser: 1.28 mg/dL — ABNORMAL HIGH (ref 0.76–1.27)
Globulin, Total: 2.8 g/dL (ref 1.5–4.5)
Glucose: 126 mg/dL — ABNORMAL HIGH (ref 70–99)
Potassium: 4.5 mmol/L (ref 3.5–5.2)
Sodium: 143 mmol/L (ref 134–144)
Total Protein: 7.6 g/dL (ref 6.0–8.5)
eGFR: 64 mL/min/{1.73_m2} (ref >59)

## 2021-04-05 LAB — CBC WITH DIFFERENTIAL/PLATELET
Basophils Absolute: 0 10*3/uL (ref 0.0–0.2)
Basos: 1 %
EOS (ABSOLUTE): 0.2 10*3/uL (ref 0.0–0.4)
Eos: 4 %
Hematocrit: 49.9 % (ref 37.5–51.0)
Hemoglobin: 17 g/dL (ref 13.0–17.7)
Immature Grans (Abs): 0 10*3/uL (ref 0.0–0.1)
Immature Granulocytes: 0 %
Lymphocytes Absolute: 1.9 10*3/uL (ref 0.7–3.1)
Lymphs: 34 %
MCH: 30.9 pg (ref 26.6–33.0)
MCHC: 34.1 g/dL (ref 31.5–35.7)
MCV: 91 fL (ref 79–97)
Monocytes Absolute: 0.5 10*3/uL (ref 0.1–0.9)
Monocytes: 9 %
Neutrophils Absolute: 2.9 10*3/uL (ref 1.4–7.0)
Neutrophils: 52 %
Platelets: 305 10*3/uL (ref 150–450)
RBC: 5.51 x10E6/uL (ref 4.14–5.80)
RDW: 13 % (ref 11.6–15.4)
WBC: 5.6 10*3/uL (ref 3.4–10.8)

## 2021-04-05 LAB — LIPID PANEL
Chol/HDL Ratio: 2.2 ratio (ref 0.0–5.0)
Cholesterol, Total: 102 mg/dL (ref 100–199)
HDL: 47 mg/dL (ref >39)
LDL Chol Calc (NIH): 41 mg/dL (ref 0–99)
Triglycerides: 66 mg/dL (ref 0–149)
VLDL Cholesterol Cal: 14 mg/dL (ref 5–40)

## 2021-04-05 LAB — B12 AND FOLATE PANEL
Folate: 13.4 ng/mL (ref >3.0)
Vitamin B-12: 423 pg/mL (ref 232–1245)

## 2021-04-05 LAB — PSA: Prostate Specific Ag, Serum: 1 ng/mL (ref 0.0–4.0)

## 2021-04-06 LAB — MICROALBUMIN / CREATININE URINE RATIO
Creatinine, Urine: 213.2 mg/dL
Microalb/Creat Ratio: 22 mg/g creat (ref 0–29)
Microalbumin, Urine: 47.4 ug/mL

## 2021-05-14 DIAGNOSIS — G4733 Obstructive sleep apnea (adult) (pediatric): Secondary | ICD-10-CM | POA: Diagnosis not present

## 2021-06-14 DIAGNOSIS — G4733 Obstructive sleep apnea (adult) (pediatric): Secondary | ICD-10-CM | POA: Diagnosis not present

## 2021-07-10 DIAGNOSIS — J301 Allergic rhinitis due to pollen: Secondary | ICD-10-CM | POA: Diagnosis not present

## 2021-07-10 DIAGNOSIS — H9312 Tinnitus, left ear: Secondary | ICD-10-CM | POA: Diagnosis not present

## 2021-07-10 DIAGNOSIS — H9042 Sensorineural hearing loss, unilateral, left ear, with unrestricted hearing on the contralateral side: Secondary | ICD-10-CM | POA: Diagnosis not present

## 2021-07-10 DIAGNOSIS — H9313 Tinnitus, bilateral: Secondary | ICD-10-CM | POA: Diagnosis not present

## 2021-07-14 ENCOUNTER — Other Ambulatory Visit: Payer: Self-pay | Admitting: Otolaryngology

## 2021-07-14 DIAGNOSIS — G4733 Obstructive sleep apnea (adult) (pediatric): Secondary | ICD-10-CM | POA: Diagnosis not present

## 2021-07-14 DIAGNOSIS — H9312 Tinnitus, left ear: Secondary | ICD-10-CM

## 2021-07-14 DIAGNOSIS — H9042 Sensorineural hearing loss, unilateral, left ear, with unrestricted hearing on the contralateral side: Secondary | ICD-10-CM

## 2021-09-04 DIAGNOSIS — G4733 Obstructive sleep apnea (adult) (pediatric): Secondary | ICD-10-CM | POA: Diagnosis not present

## 2021-10-01 ENCOUNTER — Other Ambulatory Visit: Payer: Self-pay | Admitting: Family Medicine

## 2021-10-01 DIAGNOSIS — E1169 Type 2 diabetes mellitus with other specified complication: Secondary | ICD-10-CM

## 2021-10-02 NOTE — Telephone Encounter (Signed)
Pt does need a 1 mth supply and has an appt in august

## 2021-10-04 DIAGNOSIS — G4733 Obstructive sleep apnea (adult) (pediatric): Secondary | ICD-10-CM | POA: Diagnosis not present

## 2021-10-24 ENCOUNTER — Other Ambulatory Visit: Payer: Self-pay | Admitting: Family Medicine

## 2021-10-24 DIAGNOSIS — E785 Hyperlipidemia, unspecified: Secondary | ICD-10-CM

## 2021-10-24 DIAGNOSIS — E669 Obesity, unspecified: Secondary | ICD-10-CM

## 2021-10-29 NOTE — Progress Notes (Unsigned)
Name: John York   MRN: [id number]    DOB: 01-30-1961   Date:10/30/2021       Progress Note  Subjective  Chief Complaint  Follow Up  HPI  OSA: seen by  Dr. Mortimer Fries in the past and is compliant with CPAP. He tries to sleep at least 4 hours per night . Denies morning headache and feels rested Unchanged    Morbid Obesity: BMI above 40.   He has not been going to the gym as often this Summer, and will resume a routine now. Discussed GLP-1 agonist in place of Metformin, he denies personal history of pancreatitis or family history of thyroid cancer. Discussed possible side effects  Constipation:. Bowel movements are now mostly [place]  4 , but sometimes is a 5 . He has been trying Metamucil with some improvement doing some 7 step exercise to help with bowel movement   Tinnitus: he saw Dr. Richardson Landry back in May 22 , symptoms are stable, but if gets worse he states Dr. Richardson Landry ordered MRI but he is not ready to have it done  Stable    Diabetes mellitus type 2 : he was diagnosed about 5 years ago, initially diet controlled, he started Metformin last Fall 2021  and has been doing well, A1C has improved down from 6.7 % to 6.2 % , 6.3 % and past two visits it has been 6.4 %  Last urine micro and also LDL at goal Discussed switching to GLP-1 agonist and he will ask insurance about cost    Dyslipidemia: he is taking Crestor and denies myalgia , LDL was 41 . Continue current dose    Onychomycosis: took medication in the past but same problem again, we will treat it again, normal liver enzymes done January .  Patient Active Problem List   Diagnosis Date Noted   Dyslipidemia associated with type 2 diabetes mellitus (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 Jamal Collin   COLONOSCOPY W/ POLYPECTOMY  2008   38m size sessile non bleeding polyp was found in the sigmoid colon   COLONOSCOPY WITH PROPOFOL N/A 02/24/2017   Procedure: COLONOSCOPY WITH PROPOFOL;  Surgeon: SChristene Lye MD;  Location: [facility] ENDOSCOPY;  Service: Endoscopy;  Laterality: N/A;    Family History  Problem Relation Age of Onset   Cancer Sister        breast   Colon cancer Brother    Diabetes Father    Diabetes Mother    Hypertension Mother     Social History   Tobacco Use   Smoking status: Never   Smokeless tobacco: Never  Substance Use Topics   Alcohol use: No    Alcohol/week: 0.0 standard drinks of alcohol     Current Outpatient Medications:    Blood Glucose Monitoring Suppl (CONTOUR NEXT ONE) KIT, 1 each by Does not apply route daily. Please check blood sugar once  daily.  May check 1 additional time per day as needed., Disp: 1 kit, Rfl: 0   cholecalciferol (VITAMIN D) 1000 units tablet, Take 1,000 Units daily by mouth., Disp: , Rfl:    fexofenadine (ALLEGRA ALLERGY) 180 MG tablet, Take 1 tablet (180 mg total) by mouth daily., Disp: 90 tablet, Rfl: 1   glucose blood test strip, Use as instructed, Disp: 100 each, Rfl: 12   terbinafine (LAMISIL) 250 MG tablet, Take 1 tablet (250 mg total) by mouth daily., Disp: 90 tablet, Rfl: 0   fluticasone (FLONASE) 50 MCG/ACT nasal spray, Place 2 sprays into both nostrils daily., Disp: 16 g, Rfl: 2   metFORMIN (GLUCOPHAGE) 1000 MG tablet, Take 1 tablet (1,000 mg total) by mouth daily with breakfast., Disp: 90 tablet, Rfl: 1   rosuvastatin (CRESTOR) 10 MG tablet,  Take 1 tablet (10 mg total) by mouth daily., Disp: 90 tablet, Rfl: 1  No Known Allergies  I personally reviewed active problem list, medication list, allergies, family history, social history, health maintenance with the patient/caregiver today.   ROS  Constitutional: Negative for fever or weight change.  Respiratory: Negative for cough and shortness of breath.   Cardiovascular: Negative for chest pain or palpitations.  Gastrointestinal: Negative for abdominal pain, no bowel changes.  Musculoskeletal: Negative for gait problem or joint swelling.  Skin: Negative for rash.  Neurological: Negative for dizziness or headache.  No other specific complaints in a complete review of systems (except as listed in HPI above).   Objective  Vitals:   10/30/21 0925  BP: 136/78  Pulse: 69  Resp: 16  SpO2: 97%  Weight: 296 lb (134.3 kg)  Height: 5' 9"  (1.753 m)    Body mass index is 43.71 kg/m.  Physical Exam  Constitutional: Patient appears well-developed and well-nourished. Obese  No distress.  HEENT: head atraumatic, normocephalic, pupils equal and reactive to light, neck supple, throat within normal limits Cardiovascular: Normal rate, regular rhythm and normal heart sounds.  No murmur heard. No BLE edema. Pulmonary/Chest: Effort normal and breath sounds normal. No respiratory distress. Abdominal: Soft.  There is no tenderness. Psychiatric: Patient has a normal mood and affect. behavior is normal. Judgment and thought content normal.   Recent Results (from the past 2160 hour(s))  POCT HgB A1C     Status: Abnormal   Collection Time: 10/30/21  9:27 AM  Result Value Ref Range   Hemoglobin A1C 6.4 (A) 4.0 - 5.6 %   HbA1c POC (<> result, manual entry)     HbA1c, POC (prediabetic range)     HbA1c, POC (controlled diabetic range)      Diabetic Foot Exam: Diabetic Foot Exam - Simple   Simple Foot Form Diabetic Foot exam was performed with the following findings: Yes 10/30/2021 10:22  AM  Visual Inspection See comments: Yes Sensation Testing Intact to touch and monofilament testing bilaterally: Yes Pulse Check Posterior Tibialis and Dorsalis pulse intact bilaterally: Yes Comments Brittle, thick toenails, both feet worse on right       PHQ2/9:    10/30/2021    9:25 AM 04/01/2021   10:37 AM 11/15/2020    8:27 AM 07/16/2020    9:25 AM 04/04/2020   10:43 AM  Depression screen PHQ 2/9  Decreased Interest 0 0 0 0 0  Down, Depressed, Hopeless 0 0 0 0 0  PHQ - 2 Score 0 0 0 0 0  Altered sleeping 0 0     Tired, decreased energy 0 0  Change in appetite 0 0     Feeling bad or failure about yourself  0 0     Trouble concentrating 0 0     Moving slowly or fidgety/restless 0 0     Suicidal thoughts 0 0     PHQ-9 Score 0 0       phq 9 is negative   Fall Risk:    10/30/2021    9:25 AM 04/01/2021   10:37 AM 11/15/2020    8:27 AM 07/16/2020    9:25 AM 04/04/2020   10:43 AM  Fall Risk   Falls in the past year? 0 0 0 0 0  Number falls in past yr: 0 0 0 0 0  Injury with Fall? 0 0 0 0 0  Risk for fall due to : No Fall Risks No Fall Risks No Fall Risks    Follow up Falls prevention discussed Falls prevention discussed Falls prevention discussed        Functional Status Survey: Is the patient deaf or have difficulty hearing?: No Does the patient have difficulty seeing, even when wearing glasses/contacts?: No Does the patient have difficulty concentrating, remembering, or making decisions?: No Does the patient have difficulty walking or climbing stairs?: No Does the patient have difficulty dressing or bathing?: No Does the patient have difficulty doing errands alone such as visiting a doctor's office or shopping?: No    Assessment & Plan  1. Dyslipidemia associated with type 2 diabetes mellitus (HCC)  - POCT HgB A1C - HM Diabetes Foot Exam - metFORMIN (GLUCOPHAGE) 1000 MG tablet; Take 1 tablet (1,000 mg total) by mouth daily with breakfast.  Dispense: 90 tablet;  Refill: 1 - rosuvastatin (CRESTOR) 10 MG tablet; Take 1 tablet (10 mg total) by mouth daily.  Dispense: 90 tablet; Refill: 1  2. Morbid obesity ([place])  Discussed with the patient the risk posed by an increased BMI. Discussed importance of portion control, calorie counting and at least 150 minutes of physical activity weekly. Avoid sweet beverages and drink more water. Eat at least 6 servings of fruit and vegetables daily    3. Onychomycosis of multiple toenails with type 2 diabetes mellitus (HCC)  - metFORMIN (GLUCOPHAGE) 1000 MG tablet; Take 1 tablet (1,000 mg total) by mouth daily with breakfast.  Dispense: 90 tablet; Refill: 1 - terbinafine (LAMISIL) 250 MG tablet; Take 1 tablet (250 mg total) by mouth daily.  Dispense: 90 tablet; Refill: 0  4. Need for immunization against influenza  - Flu Vaccine QUAD 25moIM (Fluarix, Fluzone & Alfiuria Quad PF)  5. Seasonal allergic rhinitis, unspecified trigger  - fluticasone (FLONASE) 50 MCG/ACT nasal spray; Place 2 sprays into both nostrils daily.  Dispense: 16 g; Refill: 2  6. OSA (obstructive sleep apnea)   7. Chronic idiopathic constipation

## 2021-10-30 ENCOUNTER — Ambulatory Visit: Payer: BC Managed Care – PPO | Admitting: Family Medicine

## 2021-10-30 ENCOUNTER — Encounter: Payer: Self-pay | Admitting: Family Medicine

## 2021-10-30 VITALS — BP 136/78 | HR 69 | Resp 16 | Ht 69.0 in | Wt 296.0 lb

## 2021-10-30 DIAGNOSIS — J302 Other seasonal allergic rhinitis: Secondary | ICD-10-CM

## 2021-10-30 DIAGNOSIS — B351 Tinea unguium: Secondary | ICD-10-CM

## 2021-10-30 DIAGNOSIS — G4733 Obstructive sleep apnea (adult) (pediatric): Secondary | ICD-10-CM

## 2021-10-30 DIAGNOSIS — Z23 Encounter for immunization: Secondary | ICD-10-CM | POA: Diagnosis not present

## 2021-10-30 DIAGNOSIS — E1169 Type 2 diabetes mellitus with other specified complication: Secondary | ICD-10-CM | POA: Diagnosis not present

## 2021-10-30 DIAGNOSIS — E785 Hyperlipidemia, unspecified: Secondary | ICD-10-CM

## 2021-10-30 DIAGNOSIS — K5904 Chronic idiopathic constipation: Secondary | ICD-10-CM

## 2021-10-30 LAB — POCT GLYCOSYLATED HEMOGLOBIN (HGB A1C): Hemoglobin A1C: 6.4 % — AB (ref 4.0–5.6)

## 2021-10-30 MED ORDER — FLUTICASONE PROPIONATE 50 MCG/ACT NA SUSP: 2.0000 | Freq: Every day | NASAL | 2 refills | Status: DC

## 2021-10-30 MED ORDER — ROSUVASTATIN CALCIUM 10 MG PO TABS: 10.0000 mg | ORAL_TABLET | Freq: Every day | ORAL | 1 refills | Status: DC

## 2021-10-30 MED ORDER — TERBINAFINE HCL 250 MG PO TABS: 250.0000 mg | ORAL_TABLET | Freq: Every day | ORAL | 0 refills | Status: DC

## 2021-10-30 MED ORDER — METFORMIN HCL 1000 MG PO TABS: 1000.0000 mg | ORAL_TABLET | Freq: Every day | ORAL | 1 refills | Status: DC

## 2021-10-30 NOTE — Patient Instructions (Signed)
Check cost with pharmacist :  Rybelsus - oral version Ozempic or Mounjarno are injectables once a week  All of them helps with weight loss but also cardiovascular protective

## 2021-11-04 DIAGNOSIS — G4733 Obstructive sleep apnea (adult) (pediatric): Secondary | ICD-10-CM | POA: Diagnosis not present

## 2021-11-25 ENCOUNTER — Encounter: Payer: Self-pay | Admitting: Physician Assistant

## 2021-11-25 ENCOUNTER — Ambulatory Visit (INDEPENDENT_AMBULATORY_CARE_PROVIDER_SITE_OTHER): Payer: Self-pay | Admitting: Physician Assistant

## 2021-11-25 VITALS — BP 130/80 | HR 79 | Temp 97.8°F | Wt 293.0 lb

## 2021-11-25 DIAGNOSIS — J3489 Other specified disorders of nose and nasal sinuses: Secondary | ICD-10-CM

## 2021-11-25 DIAGNOSIS — U071 COVID-19: Secondary | ICD-10-CM

## 2021-11-25 LAB — POC COVID19 BINAXNOW: SARS Coronavirus 2 Ag: POSITIVE — AB

## 2021-11-25 NOTE — Progress Notes (Signed)
[facility] [address] [place], [place] [address]   Office Visit Note  Patient Name: John York Date of Birth [id number]  Medical Record number [id number]  Date of Service: 11/25/2021  Chief Complaint  Patient presents with   Sinus Problem    Started Sun night .Drainage down throat, vertigo, blood in mucus. Yellowish mucus. Has not taken a COVID test.     61 y/o M presents to the clinic for c/o rhinorrhea, slight body aches, fatigue, and sinus congestion x 2-3 days. Last night he blew his nose and noticed blood. He also felt body aches after dinner. Denies fever, CP, or SOB. No known exposure to Covid. Pt is fully vaccinated and boosted.   Sinus Problem Associated symptoms include congestion and sinus pressure. Pertinent negatives include no sore throat.      Current Medication:  Outpatient Encounter Medications as of 11/25/2021  Medication Sig   Blood Glucose Monitoring Suppl (CONTOUR NEXT ONE) KIT 1 each by Does not apply route daily. Please check blood sugar once daily.  May check 1 additional time per day as needed.   cholecalciferol (VITAMIN D) 1000 units tablet Take 1,000 Units daily by mouth.   fexofenadine (ALLEGRA ALLERGY) 180 MG tablet Take 1 tablet (180 mg total) by mouth daily.   fluticasone (FLONASE) 50 MCG/ACT nasal spray Place 2 sprays into both nostrils daily.   glucose blood test strip Use as instructed   metFORMIN (GLUCOPHAGE) 1000 MG tablet Take 1 tablet (1,000 mg total) by mouth daily with breakfast.   rosuvastatin (CRESTOR) 10 MG tablet Take 1 tablet (10 mg total) by mouth daily.   terbinafine (LAMISIL) 250 MG tablet Take 1 tablet (250 mg total) by mouth daily. (Patient not taking: Reported on 11/25/2021)   No facility-administered encounter medications on file as of 11/25/2021.      Medical History: Past Medical History:  Diagnosis Date   Diabetes mellitus without complication ([place])    Family history of malignant neoplasm of  gastrointestinal tract    Personal history of colonic polyps 2013   Screening for obesity    Sleep apnea    Special screening for malignant neoplasms, colon 2013     Vital Signs: BP 130/80 (BP Location: Left Arm, Patient Position: Sitting, Cuff Size: Normal)   Pulse 79   Temp 97.8 F (36.6 C) (Tympanic)   Wt 293 lb (132.9 kg)   SpO2 98%   BMI 43.27 kg/m    Review of Systems  Constitutional: Negative.   HENT:  Positive for congestion, rhinorrhea and sinus pressure. Negative for sore throat and trouble swallowing.   Respiratory: Negative.    Cardiovascular: Negative.   Musculoskeletal:  Positive for myalgias (generalized).  Neurological: Negative.   Psychiatric/Behavioral: Negative.      Physical Exam Constitutional:      Appearance: Normal appearance. He is obese.  HENT:     Head: Atraumatic.     Right Ear: Tympanic membrane and external ear normal.     Left Ear: Tympanic membrane, ear canal and external ear normal.     Ears:     Comments: Partially occluded R ear canal with cerumen.     Nose:     Comments: Nose exam not performed at today's visit.     Mouth/Throat:     Comments: Mouth/Throat examination not performed at today's visit.  Eyes:     Extraocular Movements: Extraocular movements intact.  Cardiovascular:     Rate and Rhythm: Normal rate and regular  rhythm.  Pulmonary:     Effort: Pulmonary effort is normal.     Breath sounds: Normal breath sounds.  Musculoskeletal:     Cervical back: Neck supple.  Skin:    General: Skin is warm.  Neurological:     Mental Status: He is alert and oriented to person, place, and time.  Psychiatric:        Mood and Affect: Mood normal.        Behavior: Behavior normal.        Thought Content: Thought content normal.        Judgment: Judgment normal.       Assessment/Plan:   ICD-10-CM   1. COVID-19 virus infection  U07.1 CANCELED: POC COVID-19 BinaxNow    2. Rhinorrhea  J34.89 POC COVID-19 BinaxNow        General Counseling: caryl manas understanding of the findings of todays visit and agrees with plan of treatment. I have discussed any further diagnostic evaluation that may be needed or ordered today. We also reviewed his medications today. he has been encouraged to call the office with any questions or concerns that should arise related to todays visit.   Orders Placed This Encounter  Procedures   POC COVID-19 BinaxNow    Discussed positive test result with patient. He verbalized understanding.  Increase fluids Take OTC NSAIDs Take otc decongestant as directed on the box. Reviewed CDC guidelines with patient. Pt will be isolated for 5 days from the start of symptoms and use a mask for an additional 5 days. Must be fever free for 24 hours prior to returning to campus. He verbalized understanding and in agreement. RTC if symptoms don't improve or worsen. A work excuse note was requested by patient and in chart if patient needs it.  Pt verbalized understanding and in agreement.  Today's visit is a 25 minute F2F encounter.   Time spent:25 [place], [place] Physician Assistant

## 2021-12-09 DIAGNOSIS — E119 Type 2 diabetes mellitus without complications: Secondary | ICD-10-CM | POA: Diagnosis not present

## 2021-12-09 LAB — HM DIABETES EYE EXAM

## 2021-12-24 NOTE — Progress Notes (Signed)
Name: John York   MRN: [id number]    DOB: 06/17/1960   Date:12/25/2021       Progress Note  Subjective  Chief Complaint  Annual Exam  HPI  Patient presents for annual CPE.  IPSS Questionnaire (AUA-7): Over the past month.   1)  How often have you had a sensation of not emptying your bladder completely after you finish urinating?  5 - Almost always  2)  How often have you had to urinate again less than two hours after you finished urinating? 1 - Less than 1 time in 5  3)  How often have you found you stopped and started again several times when you urinated?  0 - Not at all  4) How difficult have you found it to postpone urination?  1 - Less than 1 time in 5  5) How often have you had a weak urinary stream?  0 - Not at all  6) How often have you had to push or strain to begin urination?  0 - Not at all  7) How many times did you most typically get up to urinate from the time you went to bed until the time you got up in the morning?  1 - 1 time  Total score:  0-7 mildly symptomatic   8-19 moderately symptomatic   20-35 severely symptomatic    Symptoms started about 6 weeks ago   Diet: cutting down on red meat, eating more chicken and some fish, high in vegetables. He still likes bread, advised him to try cutting down  Exercise: discussed 150 minutes per week  Last Dental Exam: every 6 months  Last Eye Exam: recently   Depression: phq 9 is negative    12/25/2021   10:56 AM 10/30/2021    9:25 AM 04/01/2021   10:37 AM 11/15/2020    8:27 AM 07/16/2020    9:25 AM  Depression screen PHQ 2/9  Decreased Interest 0 0 0 0 0  Down, Depressed, Hopeless 0 0 0 0 0  PHQ - 2 Score 0 0 0 0 0  Altered sleeping 0 0 0    Tired, decreased energy 0 0 0    Change in appetite 0 0 0    Feeling bad or failure about yourself  0 0 0    Trouble concentrating 0 0 0    Moving slowly or fidgety/restless 0 0 0    Suicidal thoughts 0 0 0    PHQ-9 Score 0 0 0      Hypertension:  BP Readings from  Last 3 Encounters:  12/25/21 132/82  11/25/21 130/80  10/30/21 136/78    Obesity: Wt Readings from Last 3 Encounters:  12/25/21 293 lb 4.8 oz (133 kg)  11/25/21 293 lb (132.9 kg)  10/30/21 296 lb (134.3 kg)   BMI Readings from Last 3 Encounters:  12/25/21 43.31 kg/m  11/25/21 43.27 kg/m  10/30/21 43.71 kg/m     Lipids:  Lab Results  Component Value Date   CHOL 102 04/04/2021   CHOL 105 04/09/2020   CHOL 143 09/06/2018   Lab Results  Component Value Date   HDL 47 04/04/2021   HDL 44 04/09/2020   HDL 41 09/06/2018   Lab Results  Component Value Date   LDLCALC 41 04/04/2021   LDLCALC 47 04/09/2020   LDLCALC 77 09/06/2018   Lab Results  Component Value Date   TRIG 66 04/04/2021   TRIG 64 04/09/2020   TRIG 124 09/06/2018  Lab Results  Component Value Date   CHOLHDL 2.2 04/04/2021   CHOLHDL 2.4 04/09/2020   CHOLHDL 3.5 09/06/2018   No results found for: "LDLDIRECT" Glucose:  Glucose  Date Value Ref Range Status  04/04/2021 126 (H) 70 - 99 mg/dL Final  04/09/2020 133 (H) 65 - 99 mg/dL Final  02/08/2019 94 65 - 99 mg/dL Final   Glucose, Bld  Date Value Ref Range Status  02/07/2018 87 65 - 139 mg/dL Final    Comment:    .        Non-fasting reference interval .     [place] Office Visit from 07/27/[facility] in [facility]  AUDIT-C Score 1       Single STD testing and prevention (HIV/chl/gon/syphilis): 02/07/18 Sexual history: not sexually active for over one year  Hep C Screening: 02/07/18 Skin cancer: Discussed monitoring for atypical lesions Colorectal cancer: 02/24/17 Prostate cancer:   Lab Results  Component Value Date   PSA 0.6 02/07/2018     Lung cancer:  Low Dose CT Chest recommended if Age 48-80 years, 30 pack-year currently smoking OR have quit w/in 15years. Patient  no a candidate for screening   AAA: The USPSTF recommends one-time screening with ultrasonography in men ages 37 to 58 years who have ever  smoked. Patient   no, a candidate for screening  ECG:  03/24/07  Vaccines:   HPV: N/A Tdap: up to date Shingrix: up to date Pneumonia: 05/2018 Flu: up to date COVID-19: up to date  Advanced Care Planning: A voluntary discussion about advance care planning including the explanation and discussion of advance directives.  Discussed health care proxy and Living will, and the patient was able to identify a health care proxy as daughter - Pamelia Hoit. Patient does not have a living will and power of attorney of health care   Patient Active Problem List   Diagnosis Date Noted   Dyslipidemia associated with type 2 diabetes mellitus ([place]) 11/15/2020   Constipation 12/16/2018   Seasonal allergic rhinitis 05/30/2018   Hyperlipidemia associated with type 2 diabetes mellitus ([place]) 02/08/2018   Vertigo 02/07/2018   OSA (obstructive sleep apnea) 02/07/2018   Tinnitus 06/16/2017   Class 3 severe obesity due to excess calories with serious comorbidity and body mass index (BMI) of 40.0 to 44.9 in adult ([place]) 09/25/2014   FHx: migraine headaches 09/25/2014   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 Jamal Collin   COLONOSCOPY W/ POLYPECTOMY  2008   89m size sessile non bleeding polyp was found in the sigmoid colon   COLONOSCOPY WITH PROPOFOL N/A 02/24/2017   Procedure: COLONOSCOPY WITH PROPOFOL;  Surgeon: SChristene Lye MD;  Location: [facility] ENDOSCOPY;  Service: Endoscopy;  Laterality: N/A;    Family History  Problem Relation Age of Onset   Cancer Sister        breast   Colon cancer Brother    Diabetes Father    Diabetes Mother    Hypertension Mother     Social History   Socioeconomic History   Marital status: Single    Spouse name: Not on file   Number of children: 2   Years of education: Not on file    Highest education level: Not on file  Occupational History    Comment: [employer]  Tobacco Use   Smoking status: Never   Smokeless tobacco: Never  Vaping Use   Vaping Use: Never used  Substance and Sexual Activity   Alcohol use: No    Alcohol/week: 0.0 standard drinks of alcohol   Drug use: No   Sexual activity: Not Currently  Other Topics Concern   Not on file  Social History Narrative   Not on file   Social Determinants of Health   Financial Resource Strain: Low Risk  (12/25/2021)   Overall Financial Resource Strain (CARDIA)    Difficulty of Paying Living Expenses: Not hard at all  Food Insecurity: No Food Insecurity (12/25/2021)   Hunger Vital Sign    Worried About Running Out of Food in the Last Year: Never true    Ran Out of Food in the Last Year: Never true  Transportation Needs: No Transportation Needs (12/25/2021)   PRAPARE - Hydrologist (Medical): No    Lack of Transportation (Non-Medical): No  Physical Activity: Insufficiently Active (12/25/2021)   Exercise Vital Sign    Days of Exercise per Week: 2 days    Minutes of Exercise per Session: 20 min  Stress: Stress Concern Present (12/25/2021)   [place]    Feeling of Stress : To some extent  Social Connections: Moderately Integrated (12/25/2021)   Social Connection and Isolation Panel [NHANES]    Frequency of Communication with Friends and Family: Once a week    Frequency of Social Gatherings with Friends and Family: Three times a week    Attends Religious Services: More than 4 times per year    Active Member of Clubs or Organizations: Yes    Attends Archivist Meetings: More than 4 times per year    Marital Status: Never married  Intimate Partner Violence: Not At Risk (12/25/2021)   Humiliation, Afraid, Rape, and Kick questionnaire    Fear of Current or Ex-Partner: No    Emotionally  Abused: No    Physically Abused: No    Sexually Abused: No     Current Outpatient Medications:    Blood Glucose Monitoring Suppl (CONTOUR NEXT ONE) KIT, 1 each by Does not apply route daily. Please check blood sugar once daily.  May check 1 additional time per day as needed., Disp: 1 kit, Rfl: 0   cholecalciferol (VITAMIN D) 1000 units tablet, Take 1,000 Units daily by mouth., Disp: , Rfl:    fexofenadine (ALLEGRA ALLERGY) 180 MG tablet, Take 1 tablet (180 mg total) by mouth daily., Disp: 90 tablet, Rfl: 1   fluticasone (FLONASE) 50 MCG/ACT nasal spray, Place 2 sprays into both nostrils daily., Disp: 16 g, Rfl: 2   glucose blood test strip, Use as instructed, Disp: 100 each, Rfl: 12   metFORMIN (GLUCOPHAGE) 1000 MG tablet, Take 1 tablet (1,000 mg total) by mouth daily with breakfast., Disp: 90 tablet, Rfl: 1   rosuvastatin (CRESTOR) 10 MG tablet, Take 1 tablet (10 mg total) by mouth daily., Disp: 90 tablet, Rfl: 1   terbinafine (LAMISIL) 250 MG tablet, Take 1 tablet (250 mg total) by mouth daily. (Patient not taking: Reported on 12/25/2021), Disp: 90 tablet, Rfl: 0  No Known Allergies   ROS  Constitutional: Negative for fever or weight change.  Respiratory: Negative for cough and shortness of breath.   Cardiovascular: Negative for chest pain or palpitations.  Gastrointestinal: Negative for abdominal pain, no bowel changes.  Musculoskeletal: Negative for gait problem  or joint swelling.  Skin: Negative for rash.  Neurological: Negative for dizziness or headache.  No other specific complaints in a complete review of systems (except as listed in HPI above).    Objective  Vitals:   12/25/21 1104  BP: 132/82  Pulse: 67  Resp: 18  Temp: 98.4 F (36.9 C)  TempSrc: Oral  SpO2: 97%  Weight: 293 lb 4.8 oz (133 kg)  Height: _0  (1.753 m)    Body mass index is 43.31 kg/m.  Physical Exam  Constitutional: Patient appears well-developed and well-nourished. No distress.  HENT:  Head: Normocephalic and atraumatic. Ears: B TMs ok, no erythema or effusion; Nose: Nose normal. Mouth/Throat: Oropharynx is clear and moist. No oropharyngeal exudate.  Eyes: Conjunctivae and EOM are normal. Pupils are equal, round, and reactive to light. No scleral icterus.  Neck: Normal range of motion. Neck supple. No JVD present. No thyromegaly present.  Cardiovascular: Normal rate, regular rhythm and normal heart sounds.  No murmur heard. No BLE edema. Pulmonary/Chest: Effort normal and breath sounds normal. No respiratory distress. Abdominal: Soft. Bowel sounds are normal, no distension. There is no tenderness. no masses MALE GENITALIA: Normal descended testes bilaterally, no masses palpated, right testicle slightly larger than left side , no hernias, no lesions, no discharge RECTAL: Prostate normal size and consistency, no rectal masses or hemorrhoids Musculoskeletal: Normal range of motion, no joint effusions. No gross deformities Neurological: he is alert and oriented to person, place, and time. No cranial nerve deficit. Coordination, balance, strength, speech and gait are normal.  Skin: Skin is warm and dry. No rash noted. No erythema.  Psychiatric: Patient has a normal mood and affect. behavior is normal. Judgment and thought content normal.   Recent Results (from the past 2160 hour(s))  POCT HgB A1C     Status: Abnormal   Collection Time: 10/30/21  9:27 AM  Result Value Ref Range   Hemoglobin A1C 6.4 (A) 4.0 - 5.6 %   HbA1c POC (<> result, manual entry)     HbA1c, POC (prediabetic range)     HbA1c, POC (controlled diabetic range)    POC COVID-19 BinaxNow     Status: Abnormal   Collection Time: 11/25/21  8:29 AM  Result Value Ref Range   SARS Coronavirus 2 Ag Positive (A) Negative  HM DIABETES EYE EXAM     Status: None   Collection Time: 12/09/21 12:00 AM  Result Value Ref Range   HM Diabetic Eye Exam No Retinopathy No Retinopathy     Fall Risk:    12/25/2021   10:56 AM  10/30/2021    9:25 AM 04/01/2021   10:37 AM 11/15/2020    8:27 AM 07/16/2020    9:25 AM  Fall Risk   Falls in the past year? 0 0 0 0 0  Number falls in past yr:  0 0 0 0  Injury with Fall?  0 0 0 0  Risk for fall due to : No Fall Risks No Fall Risks No Fall Risks No Fall Risks   Follow up Falls prevention discussed;Education provided;Falls evaluation completed Falls prevention discussed Falls prevention discussed Falls prevention discussed      Functional Status Survey: Is the patient deaf or have difficulty hearing?: No Does the patient have difficulty seeing, even when wearing glasses/contacts?: No Does the patient have difficulty concentrating, remembering, or making decisions?: No Does the patient have difficulty walking or climbing stairs?: No Does the patient have difficulty dressing or bathing?: No Does the  patient have difficulty doing errands alone such as visiting a doctor's office or shopping?: No    Assessment & Plan  1. Well adult exam   2. Lower urinary tract symptoms (LUTS)  - CULTURE, URINE COMPREHENSIVE  If urine culture negative we will try flomax, we will check PSA in FEb  -Prostate cancer screening and PSA options (with potential risks and benefits of testing vs not testing) were discussed along with recent recs/guidelines. -USPSTF grade A and B recommendations reviewed with patient; age-appropriate recommendations, preventive care, screening tests, etc discussed and encouraged; healthy living encouraged; see AVS for patient education given to patient -Discussed importance of 150 minutes of physical activity weekly, eat two servings of fish weekly, eat one serving of tree nuts ( cashews, pistachios, pecans, almonds.Marland Kitchen) every other day, eat 6 servings of fruit/vegetables daily and drink plenty of water and avoid sweet beverages.  -Reviewed Health Maintenance: yes

## 2021-12-24 NOTE — Patient Instructions (Signed)

## 2021-12-25 ENCOUNTER — Encounter: Payer: Self-pay | Admitting: Family Medicine

## 2021-12-25 ENCOUNTER — Ambulatory Visit (INDEPENDENT_AMBULATORY_CARE_PROVIDER_SITE_OTHER): Payer: BC Managed Care – PPO | Admitting: Family Medicine

## 2021-12-25 VITALS — BP 132/82 | HR 67 | Temp 98.4°F | Resp 18 | Ht 69.0 in | Wt 293.3 lb

## 2021-12-25 DIAGNOSIS — G4733 Obstructive sleep apnea (adult) (pediatric): Secondary | ICD-10-CM | POA: Diagnosis not present

## 2021-12-25 DIAGNOSIS — R399 Unspecified symptoms and signs involving the genitourinary system: Secondary | ICD-10-CM

## 2021-12-25 DIAGNOSIS — Z0000 Encounter for general adult medical examination without abnormal findings: Secondary | ICD-10-CM | POA: Diagnosis not present

## 2021-12-27 LAB — CULTURE, URINE COMPREHENSIVE
MICRO NUMBER:: 14047269
RESULT:: NO GROWTH
SPECIMEN QUALITY:: ADEQUATE

## 2021-12-28 ENCOUNTER — Other Ambulatory Visit: Payer: Self-pay | Admitting: Family Medicine

## 2021-12-28 MED ORDER — TAMSULOSIN HCL 0.4 MG PO CAPS: 0.4000 mg | ORAL_CAPSULE | Freq: Every day | ORAL | 0 refills | Status: DC

## 2022-01-05 ENCOUNTER — Other Ambulatory Visit: Payer: Self-pay

## 2022-01-05 DIAGNOSIS — Z1211 Encounter for screening for malignant neoplasm of colon: Secondary | ICD-10-CM

## 2022-01-25 DIAGNOSIS — G4733 Obstructive sleep apnea (adult) (pediatric): Secondary | ICD-10-CM | POA: Diagnosis not present

## 2022-01-30 ENCOUNTER — Ambulatory Visit: Payer: Self-pay | Admitting: *Deleted
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
## 2022-01-31 ENCOUNTER — Emergency Department
Admission: EM | Admit: 2022-01-31 | Discharge: 2022-01-31 | Disposition: A | Discharge status: Home / Self Care | Payer: BC Managed Care – PPO | Attending: Emergency Medicine | Admitting: Emergency Medicine

## 2022-01-31 ENCOUNTER — Emergency Department: Payer: BC Managed Care – PPO

## 2022-01-31 ENCOUNTER — Other Ambulatory Visit: Payer: Self-pay

## 2022-01-31 DIAGNOSIS — Y92410 Unspecified street and highway as the place of occurrence of the external cause: Secondary | ICD-10-CM | POA: Diagnosis not present

## 2022-01-31 DIAGNOSIS — G9389 Other specified disorders of brain: Secondary | ICD-10-CM | POA: Diagnosis not present

## 2022-01-31 DIAGNOSIS — M79642 Pain in left hand: Secondary | ICD-10-CM | POA: Diagnosis not present

## 2022-01-31 DIAGNOSIS — S199XXA Unspecified injury of neck, initial encounter: Secondary | ICD-10-CM | POA: Diagnosis not present

## 2022-01-31 DIAGNOSIS — M47812 Spondylosis without myelopathy or radiculopathy, cervical region: Secondary | ICD-10-CM | POA: Diagnosis not present

## 2022-01-31 DIAGNOSIS — M542 Cervicalgia: Secondary | ICD-10-CM | POA: Diagnosis not present

## 2022-01-31 DIAGNOSIS — M25611 Stiffness of right shoulder, not elsewhere classified: Secondary | ICD-10-CM | POA: Diagnosis not present

## 2022-01-31 DIAGNOSIS — S161XXA Strain of muscle, fascia and tendon at neck level, initial encounter: Secondary | ICD-10-CM

## 2022-01-31 DIAGNOSIS — S0990XA Unspecified injury of head, initial encounter: Secondary | ICD-10-CM | POA: Diagnosis not present

## 2022-01-31 DIAGNOSIS — R412 Retrograde amnesia: Secondary | ICD-10-CM | POA: Diagnosis not present

## 2022-01-31 DIAGNOSIS — M25612 Stiffness of left shoulder, not elsewhere classified: Secondary | ICD-10-CM | POA: Diagnosis not present

## 2022-01-31 DIAGNOSIS — R519 Headache, unspecified: Secondary | ICD-10-CM | POA: Diagnosis not present

## 2022-01-31 DIAGNOSIS — G238 Other specified degenerative diseases of basal ganglia: Secondary | ICD-10-CM | POA: Diagnosis not present

## 2022-01-31 MED ORDER — MELOXICAM 15 MG PO TABS: 15.0000 mg | ORAL_TABLET | Freq: Every day | ORAL | 2 refills | Status: DC

## 2022-01-31 MED ORDER — BACLOFEN 10 MG PO TABS: 10.0000 mg | ORAL_TABLET | Freq: Three times a day (TID) | ORAL | 0 refills | Status: AC
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
## 2022-02-24 DIAGNOSIS — G4733 Obstructive sleep apnea (adult) (pediatric): Secondary | ICD-10-CM | POA: Diagnosis not present

## 2022-02-26 ENCOUNTER — Encounter: Payer: Self-pay | Admitting: Family Medicine

## 2022-03-02 ENCOUNTER — Other Ambulatory Visit: Payer: Self-pay

## 2022-03-02 ENCOUNTER — Telehealth: Payer: Self-pay

## 2022-03-02 DIAGNOSIS — Z86010 Personal history of colonic polyps: Secondary | ICD-10-CM

## 2022-03-02 MED ORDER — NA SULFATE-K SULFATE-MG SULF 17.5-3.13-1.6 GM/177ML PO SOLN: 1.0000 | Freq: Once | ORAL | 0 refills | Status: AC
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
## 2022-03-02 NOTE — Telephone Encounter (Signed)
Gastroenterology Pre-Procedure Review  Request Date: 03/26/22 Requesting Physician: Dr. Marius Ditch  PATIENT REVIEW QUESTIONS: The patient responded to the following health history questions as indicated:    1. Are you having any GI issues?  Occasional constipation 2. Do you have a personal history of Polyps? yes (last colonoscopy 2018 with Dr. Jamal Collin) 3. Do you have a family history of Colon Cancer or Polyps? no 4. Diabetes Mellitus? yes (advised to stop metformin 2 days prior to colonoscopy) 5. Joint replacements in the past 12 months?no 6. Major health problems in the past 3 months?no 7. Any artificial heart valves, MVP, or defibrillator?no    MEDICATIONS & ALLERGIES:    Patient reports the following regarding taking any anticoagulation/antiplatelet therapy:   Plavix, Coumadin, Eliquis, Xarelto, Lovenox, Pradaxa, Brilinta, or Effient? no Aspirin? no  Patient confirms/reports the following medications:  Current Outpatient Medications  Medication Sig Dispense Refill   Blood Glucose Monitoring Suppl (CONTOUR NEXT ONE) KIT 1 each by Does not apply route daily. Please check blood sugar once daily.  May check 1 additional time per day as needed. 1 kit 0   cholecalciferol (VITAMIN D) 1000 units tablet Take 1,000 Units daily by mouth.     fexofenadine (ALLEGRA ALLERGY) 180 MG tablet Take 1 tablet (180 mg total) by mouth daily. 90 tablet 1   fluticasone (FLONASE) 50 MCG/ACT nasal spray Place 2 sprays into both nostrils daily. 16 g 2   glucose blood test strip Use as instructed 100 each 12   meloxicam (MOBIC) 15 MG tablet Take 1 tablet (15 mg total) by mouth daily. 30 tablet 2   metFORMIN (GLUCOPHAGE) 1000 MG tablet Take 1 tablet (1,000 mg total) by mouth daily with breakfast. 90 tablet 1   rosuvastatin (CRESTOR) 10 MG tablet Take 1 tablet (10 mg total) by mouth daily. 90 tablet 1   tamsulosin (FLOMAX) 0.4 MG CAPS capsule Take 1 capsule (0.4 mg total) by mouth daily. 90 capsule 0   terbinafine  (LAMISIL) 250 MG tablet Take 1 tablet (250 mg total) by mouth daily. (Patient not taking: Reported on 12/25/2021) 90 tablet 0   No current facility-administered medications for this visit.    Patient confirms/reports the following allergies:  No Known Allergies  No orders of the defined types were placed in this encounter.   AUTHORIZATION INFORMATION Primary Insurance: 1D#: Group #:  Secondary Insurance: 1D#: Group #:  SCHEDULE INFORMATION: Date: 03/26/22 Time: Location: [facility]

## 2022-03-03 ENCOUNTER — Encounter: Payer: Self-pay | Admitting: Family Medicine

## 2022-03-03 ENCOUNTER — Ambulatory Visit: Payer: Self-pay | Admitting: Family Medicine

## 2022-03-03 DIAGNOSIS — R42 Dizziness and giddiness: Secondary | ICD-10-CM

## 2022-03-03 DIAGNOSIS — M25532 Pain in left wrist: Secondary | ICD-10-CM

## 2022-03-03 DIAGNOSIS — F0781 Postconcussional syndrome: Secondary | ICD-10-CM

## 2022-03-03 MED ORDER — AMITRIPTYLINE HCL 25 MG PO TABS: 25.0000 mg | ORAL_TABLET | Freq: Every day | ORAL | 0 refills | Status: DC

## 2022-03-12 ENCOUNTER — Ambulatory Visit: Payer: BC Managed Care – PPO | Attending: Family Medicine

## 2022-03-12 DIAGNOSIS — M6281 Muscle weakness (generalized): Secondary | ICD-10-CM | POA: Insufficient documentation

## 2022-03-12 DIAGNOSIS — M25532 Pain in left wrist: Secondary | ICD-10-CM | POA: Diagnosis not present
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
## 2022-03-13 ENCOUNTER — Encounter: Payer: Self-pay | Admitting: Family Medicine

## 2022-03-13 ENCOUNTER — Ambulatory Visit
Admission: RE | Admit: 2022-03-13 | Discharge: 2022-03-13 | Disposition: A | Discharge status: Home / Self Care | Payer: BC Managed Care – PPO | Source: Ambulatory Visit | Attending: Family Medicine | Admitting: Family Medicine

## 2022-03-13 ENCOUNTER — Telehealth: Payer: Self-pay

## 2022-03-13 DIAGNOSIS — R42 Dizziness and giddiness: Secondary | ICD-10-CM | POA: Insufficient documentation

## 2022-03-13 DIAGNOSIS — F0781 Postconcussional syndrome: Secondary | ICD-10-CM | POA: Diagnosis not present

## 2022-03-13 DIAGNOSIS — X58XXXS Exposure to other specified factors, sequela: Secondary | ICD-10-CM | POA: Diagnosis not present

## 2022-03-13 DIAGNOSIS — H9192 Unspecified hearing loss, left ear: Secondary | ICD-10-CM | POA: Diagnosis not present

## 2022-03-13 DIAGNOSIS — H9312 Tinnitus, left ear: Secondary | ICD-10-CM | POA: Diagnosis not present

## 2022-03-13 DIAGNOSIS — R9082 White matter disease, unspecified: Secondary | ICD-10-CM | POA: Diagnosis not present

## 2022-03-13 DIAGNOSIS — R9402 Abnormal brain scan: Secondary | ICD-10-CM | POA: Insufficient documentation

## 2022-03-13 MED ORDER — GADOBUTROL 1 MMOL/ML IV SOLN
10.0000 mL | Freq: Once | INTRAVENOUS | Status: AC | PRN
  Administered 2022-03-13: 10 mL via INTRAVENOUS

## 2022-03-13 NOTE — Telephone Encounter (Signed)
Copied from [place] [id number]. Topic: General - Inquiry >> Mar 13, 2022 11:18 AM Rosanne Ashing P wrote: Pt called asking if the contrast that was used with his MRI will effect his metformin.  CB# [phone]    Also sent a MyChart message. Pls advise

## 2022-03-13 NOTE — Telephone Encounter (Signed)
Patient saw your MyChart message and is aware

## 2022-03-23 ENCOUNTER — Ambulatory Visit: Payer: BC Managed Care – PPO | Attending: Family Medicine

## 2022-03-23 DIAGNOSIS — M6281 Muscle weakness (generalized): Secondary | ICD-10-CM | POA: Diagnosis not present

## 2022-03-23 DIAGNOSIS — M25532 Pain in left wrist: Secondary | ICD-10-CM | POA: Insufficient documentation

## 2022-03-23 NOTE — Therapy (Unsigned)
Active ROM Right eval Left eval   Shoulder flexion       Shoulder abduction       Shoulder adduction       Shoulder extension       Shoulder internal rotation       Shoulder external rotation       Elbow flexion       Elbow extension       Wrist flexion 57 50 55  Wrist extension 50 50 50  Wrist ulnar deviation 31 30   Wrist radial deviation 25 38   Wrist pronation 82 84   Wrist supination 77 76 77
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
## 2022-03-26 ENCOUNTER — Encounter: Payer: Self-pay | Admitting: Gastroenterology

## 2022-03-26 ENCOUNTER — Ambulatory Visit: Payer: BC Managed Care – PPO | Admitting: Anesthesiology

## 2022-03-26 ENCOUNTER — Ambulatory Visit
Admission: RE | Admit: 2022-03-26 | Discharge: 2022-03-26 | Disposition: A | Discharge status: Home / Self Care | Payer: BC Managed Care – PPO | Source: Ambulatory Visit | Attending: Gastroenterology | Admitting: Gastroenterology

## 2022-03-26 ENCOUNTER — Encounter
Admission: RE | Disposition: A | Discharge status: Home / Self Care | Payer: Self-pay | Source: Ambulatory Visit | Attending: Gastroenterology

## 2022-03-26 ENCOUNTER — Other Ambulatory Visit: Payer: Self-pay | Admitting: Family Medicine

## 2022-03-26 DIAGNOSIS — Z86010 Personal history of colonic polyps: Secondary | ICD-10-CM | POA: Insufficient documentation

## 2022-03-26 DIAGNOSIS — Z6841 Body Mass Index (BMI) 40.0 and over, adult: Secondary | ICD-10-CM | POA: Insufficient documentation

## 2022-03-26 DIAGNOSIS — Z1211 Encounter for screening for malignant neoplasm of colon: Secondary | ICD-10-CM | POA: Diagnosis not present

## 2022-03-26 DIAGNOSIS — E119 Type 2 diabetes mellitus without complications: Secondary | ICD-10-CM | POA: Insufficient documentation

## 2022-03-26 DIAGNOSIS — K644 Residual hemorrhoidal skin tags: Secondary | ICD-10-CM | POA: Insufficient documentation

## 2022-03-26 DIAGNOSIS — K635 Polyp of colon: Secondary | ICD-10-CM | POA: Diagnosis not present

## 2022-03-26 DIAGNOSIS — Z7984 Long term (current) use of oral hypoglycemic drugs: Secondary | ICD-10-CM | POA: Insufficient documentation

## 2022-03-26 DIAGNOSIS — Z800 Family history of malignant neoplasm of digestive organs: Secondary | ICD-10-CM | POA: Diagnosis not present

## 2022-03-26 DIAGNOSIS — Z833 Family history of diabetes mellitus: Secondary | ICD-10-CM | POA: Insufficient documentation

## 2022-03-26 DIAGNOSIS — D123 Benign neoplasm of transverse colon: Secondary | ICD-10-CM | POA: Diagnosis not present

## 2022-03-26 DIAGNOSIS — F0781 Postconcussional syndrome: Secondary | ICD-10-CM

## 2022-03-26 HISTORY — PX: COLONOSCOPY WITH PROPOFOL: SHX5780

## 2022-03-26 LAB — GLUCOSE, CAPILLARY: Glucose-Capillary: 128 mg/dL — ABNORMAL HIGH (ref 70–99)

## 2022-03-26 SURGERY — COLONOSCOPY WITH PROPOFOL
Anesthesia: General

## 2022-03-26 MED ORDER — PROPOFOL 500 MG/50ML IV EMUL
INTRAVENOUS | Status: DC | PRN
  Administered 2022-03-26: 150 ug/kg/min via INTRAVENOUS

## 2022-03-26 MED ORDER — PROPOFOL 10 MG/ML IV BOLUS
INTRAVENOUS | Status: DC | PRN
  Administered 2022-03-26: 50 mg via INTRAVENOUS
  Administered 2022-03-26: 30 mg via INTRAVENOUS

## 2022-03-26 MED ORDER — PROPOFOL 1000 MG/100ML IV EMUL
INTRAVENOUS | Status: AC
  Filled 2022-03-26: qty 100

## 2022-03-26 MED ORDER — SODIUM CHLORIDE 0.9 % IV SOLN: INTRAVENOUS | Status: DC

## 2022-03-26 NOTE — Op Note (Signed)
[facility]astroenterology Patient Name: John York Procedure Date: 03/26/2022 8:56 AM MRN: [id number] Account #: [id number] Date of Birth: October 30, 1960 Admit Type: Outpatient Age: 62 Room: [facility] ENDO ROOM 3 Gender: Male Note Status: Finalized Instrument Name: Jasper Riling [id number] Procedure:             Colonoscopy Indications:           Screening in patient at increased risk: Colorectal                         cancer in brother before age 48, Surveillance:                         Personal history of adenomatous polyps on last                         colonoscopy 5 years ago, Last colonoscopy: December                         2018 Providers:             Lin Landsman MD, MD Referring MD:          Bethena Roys. Sowles, MD (Referring MD) Medicines:             General Anesthesia Complications:         No immediate complications. Estimated blood loss: None. Procedure:             Pre-Anesthesia Assessment:                        - Prior to the procedure, a History and Physical was                         performed, and patient medications and allergies were                         reviewed. The patient is competent. The risks and                         benefits of the procedure and the sedation options and                         risks were discussed with the patient. All questions                         were answered and informed consent was obtained.                         Patient identification and proposed procedure were                         verified by the physician, the nurse, the                         anesthesiologist, the anesthetist and the technician                         in the pre-procedure area in the procedure room in the  endoscopy suite. Mental Status Examination: alert and                         oriented. Airway Examination: normal oropharyngeal                         airway and neck mobility. Respiratory  Examination:                         clear to auscultation. CV Examination: normal.                         Prophylactic Antibiotics: The patient does not require                         prophylactic antibiotics. Prior Anticoagulants: The                         patient has taken no anticoagulant or antiplatelet                         agents. ASA Grade Assessment: III - A patient with                         severe systemic disease. After reviewing the risks and                         benefits, the patient was deemed in satisfactory                         condition to undergo the procedure. The anesthesia                         plan was to use general anesthesia. Immediately prior                         to administration of medications, the patient was                         re-assessed for adequacy to receive sedatives. The                         heart rate, respiratory rate, oxygen saturations,                         blood pressure, adequacy of pulmonary ventilation, and                         response to care were monitored throughout the                         procedure. The physical status of the patient was                         re-assessed after the procedure.                        After obtaining informed consent, the colonoscope was  passed under direct vision. Throughout the procedure,                         the patient's blood pressure, pulse, and oxygen                         saturations were monitored continuously. The                         Colonoscope was introduced through the anus and                         advanced to the the cecum, identified by appendiceal                         orifice and ileocecal valve. The colonoscopy was                         performed without difficulty. The patient tolerated                         the procedure well. The quality of the bowel                         preparation was evaluated using the  BBPS [facility] Bowel                         Preparation Scale) with scores of: Right Colon = 3,                         Transverse Colon = 3 and Left Colon = 3 (entire mucosa                         seen well with no residual staining, small fragments                         of stool or opaque liquid). The total BBPS score                         equals 9. The ileocecal valve, appendiceal orifice,                         and rectum were photographed. Findings:      The perianal and digital rectal examinations were normal. Pertinent       negatives include normal sphincter tone and no palpable rectal lesions.      A diminutive polyp was found in the ascending colon. The polyp was       sessile. The polyp was removed with a cold biopsy forceps. Resection and       retrieval were complete. Estimated blood loss: none.      A 6 mm polyp was found in the transverse colon. The polyp was sessile.       The polyp was removed with a cold snare. Resection and retrieval were       complete. Estimated blood loss: none.      Non-bleeding external hemorrhoids were found during retroflexion. The       hemorrhoids were medium-sized.  The exam was otherwise without abnormality. Impression:            - One diminutive polyp in the ascending colon, removed                         with a cold biopsy forceps. Resected and retrieved.                        - One 6 mm polyp in the transverse colon, removed with                         a cold snare. Resected and retrieved.                        - Non-bleeding external hemorrhoids.                        - The examination was otherwise normal. Recommendation:        - Discharge patient to home (with escort).                        - Resume previous diet today.                        - Continue present medications.                        - Await pathology results.                        - Repeat colonoscopy in 5 years for surveillance. Procedure Code(s):      --- Professional ---                        [phone], Colonoscopy, flexible; with removal of                         tumor(s), polyp(s), or other lesion(s) by snare                         technique                        45380, 99, Colonoscopy, flexible; with biopsy, single                         or multiple Diagnosis Code(s):     --- Professional ---                        Z80.0, Family history of malignant neoplasm of                         digestive organs                        Z86.010, Personal history of colonic polyps                        D12.2, Benign neoplasm of ascending colon  D12.3, Benign neoplasm of transverse colon (hepatic                         flexure or splenic flexure)                        K64.4, Residual hemorrhoidal skin tags CPT copyright 2022 American Medical Association. All rights reserved. The codes documented in this report are preliminary and upon coder review may  be revised to meet current compliance requirements. Dr. Ulyess Mort Lin Landsman MD, MD 03/26/2022 9:32:46 AM This report has been signed electronically. Number of Addenda: 0 Note Initiated On: 03/26/2022 8:56 AM Scope Withdrawal Time: 0 hours 14 minutes 35 seconds  Total Procedure Duration: 0 hours 18 minutes 38 seconds       [facility]

## 2022-03-26 NOTE — Transfer of Care (Signed)
Immediate Anesthesia Transfer of Care Note  Patient: John York  Procedure(s) Performed: COLONOSCOPY WITH PROPOFOL  Patient Location: PACU  Anesthesia Type:General  Level of Consciousness: awake and alert   Airway & Oxygen Therapy: Patient Spontanous Breathing and Patient connected to face mask oxygen  Post-op Assessment: Report given to RN and Post -op Vital signs reviewed and stable  Post vital signs: Reviewed and stable  Last Vitals:  Vitals Value Taken Time  BP 97/58 03/26/22 0935  Temp 36 C 03/26/22 0934  Pulse 67 03/26/22 0939  Resp 32 03/26/22 0939  SpO2 100 % 03/26/22 0939  Vitals shown include unvalidated device data.  Last Pain:  Vitals:   03/26/22 0934  TempSrc: Temporal  PainSc: Asleep         Complications: No notable events documented.

## 2022-03-26 NOTE — H&P (Signed)
Cephas Darby, MD [address]  [place]  [place], [place] [address]  Main: [phone]  Fax: [phone] Pager: [phone]  Primary Care Physician:  Steele Sizer, MD Primary Gastroenterologist:  Dr. Cephas Darby  Pre-Procedure History & Physical: HPI:  John York is a 62 y.o. male is here for an colonoscopy.   Past Medical History:  Diagnosis Date   Diabetes mellitus without complication ([place])    Family history of malignant neoplasm of gastrointestinal tract    Personal history of colonic polyps 2013   Screening for obesity    Sleep apnea    Special screening for malignant neoplasms, colon 2013    Past Surgical History:  Procedure Laterality Date   COLONOSCOPY  02/03/2012   Dr. Jamal Collin   COLONOSCOPY W/ POLYPECTOMY  2008   32m size sessile non bleeding polyp was found in the sigmoid colon   COLONOSCOPY WITH PROPOFOL N/A 02/24/2017   Procedure: COLONOSCOPY WITH PROPOFOL;  Surgeon: SChristene Lye MD;  Location: [facility] ENDOSCOPY;  Service: Endoscopy;  Laterality: N/A;    Prior to Admission medications   Medication Sig Start Date End Date Taking? Authorizing Provider  amitriptyline (ELAVIL) 25 MG tablet Take 1 tablet (25 mg total) by mouth at bedtime. 03/03/22   SSteele Sizer MD  Blood Glucose Monitoring Suppl (CONTOUR NEXT ONE) KIT 1 each by Does not apply route daily. Please check blood sugar once daily.  May check 1 additional time per day as needed. 09/09/18   BHubbard Hartshorn FNP  cholecalciferol (VITAMIN D) 1000 units tablet Take 1,000 Units daily by mouth.    [provider]  fexofenadine (ALLEGRA ALLERGY) 180 MG tablet Take 1 tablet (180 mg total) by mouth daily. 05/30/18   BHubbard Hartshorn FNP  fluticasone (FLONASE) 50 MCG/ACT nasal spray Place 2 sprays into both nostrils daily. 10/30/21   SSteele Sizer MD  glucose blood test strip Use as instructed 08/30/18   BHubbard Hartshorn FNP  metFORMIN (GLUCOPHAGE) 1000 MG tablet Take 1  tablet (1,000 mg total) by mouth daily with breakfast. 10/30/21   SSteele Sizer MD  rosuvastatin (CRESTOR) 10 MG tablet Take 1 tablet (10 mg total) by mouth daily. 10/30/21   SSteele Sizer MD  tamsulosin (FLOMAX) 0.4 MG CAPS capsule Take 1 capsule (0.4 mg total) by mouth daily. 12/28/21   SSteele Sizer MD  terbinafine (LAMISIL) 250 MG tablet Take 1 tablet (250 mg total) by mouth daily. 10/30/21   SSteele Sizer MD    Allergies as of 03/02/2022   (No Known Allergies)    Family History  Problem Relation Age of Onset   Cancer Sister        breast   Colon cancer Brother    Diabetes Father    Diabetes Mother    Hypertension Mother     Social History   Socioeconomic History   Marital status: Single    Spouse name: Not on file   Number of children: 2   Years of education: Not on file   Highest education level: Not on file  Occupational History    Comment: [employer]  Tobacco Use   Smoking status: Never   Smokeless tobacco: Never  Vaping Use   Vaping Use: Never used  Substance and Sexual Activity   Alcohol use: No    Alcohol/week: 0.0 standard drinks of alcohol   Drug use: No   Sexual activity: Not Currently  Other Topics Concern   Not on file  Social History Narrative   Not on file   Social Determinants of Health   Financial Resource Strain: Low Risk  (12/25/2021)   Overall Financial Resource Strain (CARDIA)    Difficulty of Paying Living Expenses: Not hard at all  Food Insecurity: No Food Insecurity (12/25/2021)   Hunger Vital Sign    Worried About Running Out of Food in the Last Year: Never true    Ran Out of Food in the Last Year: Never true  Transportation Needs: No Transportation Needs (12/25/2021)   PRAPARE - Hydrologist (Medical): No    Lack of Transportation (Non-Medical): No  Physical Activity: Insufficiently Active (12/25/2021)   Exercise Vital Sign    Days of Exercise per Week: 2 days    Minutes of  Exercise per Session: 20 min  Stress: Stress Concern Present (12/25/2021)   [place]    Feeling of Stress : To some extent  Social Connections: Moderately Integrated (12/25/2021)   Social Connection and Isolation Panel [NHANES]    Frequency of Communication with Friends and Family: Once a week    Frequency of Social Gatherings with Friends and Family: Three times a week    Attends Religious Services: More than 4 times per year    Active Member of Clubs or Organizations: Yes    Attends Archivist Meetings: More than 4 times per year    Marital Status: Never married  Intimate Partner Violence: Not At Risk (12/25/2021)   Humiliation, Afraid, Rape, and Kick questionnaire    Fear of Current or Ex-Partner: No    Emotionally Abused: No    Physically Abused: No    Sexually Abused: No    Review of Systems: See HPI, otherwise negative ROS  Physical Exam: BP (!) 141/78   Pulse 66   Temp (!) 97.4 F (36.3 C) (Temporal)   Resp 16   Wt 128.5 kg   SpO2 100%   BMI 41.82 kg/m  General:   Alert,  pleasant and cooperative in NAD Head:  Normocephalic and atraumatic. Neck:  Supple; no masses or thyromegaly. Lungs:  Clear throughout to auscultation.    Heart:  Regular rate and rhythm. Abdomen:  Soft, nontender and nondistended. Normal bowel sounds, without guarding, and without rebound.   Neurologic:  Alert and  oriented x4;  grossly normal neurologically.  Impression/Plan: John York is here for an colonoscopy to be performed for h/o adenoma colon  Risks, benefits, limitations, and alternatives regarding  colonoscopy have been reviewed with the patient.  Questions have been answered.  All parties agreeable.   Sherri Sear, MD  03/26/2022, 8:57 AM

## 2022-03-26 NOTE — Anesthesia Preprocedure Evaluation (Signed)
Anesthesia Evaluation  Patient identified by MRN, date of birth, ID band Patient awake    Reviewed: Allergy & Precautions, NPO status , Patient's Chart, lab work & pertinent test results  History of Anesthesia Complications Negative for: history of anesthetic complications  Airway Mallampati: III  TM Distance: >3 FB Neck ROM: Full    Dental no notable dental hx. (+) Dental Advidsory Given   Pulmonary neg pulmonary ROS, neg shortness of breath, neg sleep apnea, neg COPD, neg recent URI   breath sounds clear to auscultation- rhonchi (-) wheezing      Cardiovascular Exercise Tolerance: Good (-) hypertension(-) angina (-) CAD, (-) Past MI and (-) Cardiac Stents (-) dysrhythmias  Rhythm:Regular Rate:Normal - Systolic murmurs and - Diastolic murmurs    Neuro/Psych negative neurological ROS  negative psych ROS   GI/Hepatic negative GI ROS, Neg liver ROS,,,  Endo/Other  diabetes  Morbid obesity  Renal/GU negative Renal ROS     Musculoskeletal negative musculoskeletal ROS (+)    Abdominal  (+) + obese  Peds  Hematology negative hematology ROS (+)   Anesthesia Other Findings Past Medical History: No date: Family history of malignant neoplasm of gastrointestinal  tract 2013: Personal history of colonic polyps No date: Screening for obesity 2013: Special screening for malignant neoplasms, colon   Reproductive/Obstetrics                             Anesthesia Physical Anesthesia Plan  ASA: 3  Anesthesia Plan: General   Post-op Pain Management:    Induction: Intravenous  PONV Risk Score and Plan: 1 and Propofol infusion  Airway Management Planned: Natural Airway  Additional Equipment:   Intra-op Plan:   Post-operative Plan:   Informed Consent: I have reviewed the patients History and Physical, chart, labs and discussed the procedure including the risks, benefits and alternatives for  the proposed anesthesia with the patient or authorized representative who has indicated his/her understanding and acceptance.     Dental advisory given  Plan Discussed with: CRNA and Anesthesiologist  Anesthesia Plan Comments:         Anesthesia Quick Evaluation

## 2022-03-26 NOTE — Anesthesia Postprocedure Evaluation (Signed)
Anesthesia Post Note  Patient: TSUGIO ELISON  Procedure(s) Performed: COLONOSCOPY WITH PROPOFOL  Patient location during evaluation: Endoscopy Anesthesia Type: General Level of consciousness: awake and alert Pain management: pain level controlled Vital Signs Assessment: post-procedure vital signs reviewed and stable Respiratory status: spontaneous breathing, nonlabored ventilation, respiratory function stable and patient connected to nasal cannula oxygen Cardiovascular status: blood pressure returned to baseline and stable Postop Assessment: no apparent nausea or vomiting Anesthetic complications: no   No notable events documented.   Last Vitals:  Vitals:   03/26/22 0954 03/26/22 1004  BP:  120/73  Pulse:    Resp:    Temp:    SpO2: 100%     Last Pain:  Vitals:   03/26/22 1004  TempSrc:   PainSc: 0-No pain                 Martha Clan

## 2022-03-27 ENCOUNTER — Encounter: Payer: Self-pay | Admitting: Gastroenterology

## 2022-03-27 LAB — SURGICAL PATHOLOGY

## 2022-03-30 DIAGNOSIS — G4733 Obstructive sleep apnea (adult) (pediatric): Secondary | ICD-10-CM | POA: Diagnosis not present

## 2022-04-02 ENCOUNTER — Other Ambulatory Visit: Payer: Self-pay | Admitting: Family Medicine

## 2022-04-03 NOTE — Progress Notes (Signed)
Name: John York   MRN: [id number]    DOB: 01-Feb-1961   Date:04/06/2022       Progress Note  Subjective  Chief Complaint  Follow Up  HPI  OSA: seen by  Dr. Mortimer Fries in the past and is compliant with CPAP. He tries to sleep with mask  at least 4 hours per night    Morbid Obesity: BMI above 40.   He has not been going to the gym, he has been doing some stretching, discussed mobility work out    Constipation:. Bowel movements are now mostly [place]  4 , doing well since colonoscopy   LUTS: takes flomax and it helps with his flow , still has nocturia once per night   AR: doing well at this time , uses flonase prn    Tinnitus: he saw Dr. Richardson Landry back in May 22 , symptoms are stable, but if gets worse he states Dr. Richardson Landry ordered MRI and he finally had it done 03/17/2022 and did not show schwannoma but he has mild white matter changes in the frontal lobes . He will contact Dr. Richardson Landry   Diabetes mellitus type 2 : he was diagnosed around 2018 initially diet controlled, he started Metformin last Fall 2021  and has been doing well, A1C has improved down from 6.7 % to 6.2 % , 6.3 % and past two visits it has been 6.4 %, he would like try switching from Metformin to a new medication, advised to try adding Rybelsus and discontinue metfomrin if we are able to get A1C below 6 %   He denies personal history of pancreatitis or family history of thyroid cancer. Discussed possible side effects    Dyslipidemia: he is taking Crestor and denies myalgia , LDL was 41 .We will recheck labs    Onychomycosis toes: finished lamisil  Patient Active Problem List   Diagnosis Date Noted   Polyp of ascending colon 03/26/2022   Polyp of transverse colon 03/26/2022   Dyslipidemia associated with type 2 diabetes mellitus ([place]) 11/15/2020   Constipation 12/16/2018   Seasonal allergic rhinitis 05/30/2018   Hyperlipidemia associated with type 2 diabetes mellitus ([place]) 02/08/2018   Vertigo 02/07/2018   OSA  (obstructive sleep apnea) 02/07/2018   Tinnitus 06/16/2017   Morbid obesity ([place]) 09/25/2014   FHx: migraine headaches 09/25/2014   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 Jamal Collin   COLONOSCOPY W/ POLYPECTOMY  2008   49m size sessile non bleeding polyp was found in the sigmoid colon   COLONOSCOPY WITH PROPOFOL N/A 02/24/2017   Procedure: COLONOSCOPY WITH PROPOFOL;  Surgeon: SChristene Lye MD;  Location: [facility] ENDOSCOPY;  Service: Endoscopy;  Laterality: N/A;   COLONOSCOPY WITH PROPOFOL N/A 03/26/2022   Procedure: COLONOSCOPY WITH PROPOFOL;  Surgeon: VLin Landsman MD;  [facility];  Service: Gastroenterology;  Laterality: N/A;    Family History  Problem Relation Age of Onset   Cancer Sister        breast   Colon cancer Brother    Diabetes Father    Diabetes Mother    Hypertension Mother     Social History   Tobacco Use   Smoking status: Never   Smokeless tobacco: Never  Substance Use Topics   Alcohol use: No    Alcohol/week: 0.0 standard drinks of  alcohol     Current Outpatient Medications:    Blood Glucose Monitoring Suppl (CONTOUR NEXT ONE) KIT, 1 each by Does not apply route daily. Please check blood sugar once daily.  May check 1 additional time per day as needed., Disp: 1 kit, Rfl: 0   cholecalciferol (VITAMIN D) 1000 units tablet, Take 1,000 Units daily by mouth., Disp: , Rfl:    fexofenadine (ALLEGRA ALLERGY) 180 MG tablet, Take 1 tablet (180 mg total) by mouth daily., Disp: 90 tablet, Rfl: 1   fluticasone (FLONASE) 50 MCG/ACT nasal spray, Place 2 sprays into both nostrils daily., Disp: 16 g, Rfl: 2   glucose blood test strip, Use as instructed, Disp: 100 each, Rfl: 12   metFORMIN (GLUCOPHAGE) 1000 MG tablet, Take 1 tablet (1,000 mg total) by mouth daily with  breakfast., Disp: 90 tablet, Rfl: 1   rosuvastatin (CRESTOR) 10 MG tablet, Take 1 tablet (10 mg total) by mouth daily., Disp: 90 tablet, Rfl: 1   Semaglutide (RYBELSUS) 7 MG TABS, Take 1 tablet (7 mg total) by mouth daily., Disp: 90 tablet, Rfl: 0   tamsulosin (FLOMAX) 0.4 MG CAPS capsule, Take 1 capsule (0.4 mg total) by mouth daily., Disp: 90 capsule, Rfl: 3  No Known Allergies  I personally reviewed active problem list, medication list, allergies, family history, social history, health maintenance with the patient/caregiver today.   ROS  Constitutional: Negative for fever or weight change.  Respiratory: Negative for cough and shortness of breath.   Cardiovascular: Negative for chest pain or palpitations.  Gastrointestinal: Negative for abdominal pain, no bowel changes.  Musculoskeletal: Negative for gait problem or joint swelling.  Skin: Negative for rash.  Neurological: Negative for dizziness or headache.  No other specific complaints in a complete review of systems (except as listed in HPI above).   Objective  Vitals:   04/06/22 0835  BP: 126/70  Pulse: 84  Resp: 18  Temp: 98.3 F (36.8 C)  TempSrc: Oral  SpO2: 98%  Weight: 292 lb 14.4 oz (132.9 kg)  Height: '5\' 9"'$  (1.753 m)    Body mass index is 43.25 kg/m.  Physical Exam  Constitutional: Patient appears well-developed and well-nourished. Obese  No distress.  HEENT: head atraumatic, normocephalic, pupils equal and reactive to light, ears normal TM, neck supple, throat within normal limits Cardiovascular: Normal rate, regular rhythm and normal heart sounds.  No murmur heard. No BLE edema. Pulmonary/Chest: Effort normal and breath sounds normal. No respiratory distress. Abdominal: Soft.  There is no tenderness. Psychiatric: Patient has a normal mood and affect. behavior is normal. Judgment and thought content normal.    PHQ2/9:    04/06/2022    8:37 AM 03/03/2022    7:54 AM 12/25/2021   10:56 AM 10/30/2021     9:25 AM 04/01/2021   10:37 AM  Depression screen PHQ 2/9  Decreased Interest 0 0 0 0 0  Down, Depressed, Hopeless 0 0 0 0 0  PHQ - 2 Score 0 0 0 0 0  Altered sleeping 0 0 0 0 0  Tired, decreased energy 0 0 0 0 0  Change in appetite 0 0 0 0 0  Feeling bad or failure about yourself  0 0 0 0 0  Trouble concentrating 0 0 0 0 0  Moving slowly or fidgety/restless 0 0 0 0 0  Suicidal thoughts 0 0 0 0 0  PHQ-9 Score 0 0 0 0 0    phq 9 is negative   Fall Risk:  04/06/2022    8:37 AM 03/03/2022    7:54 AM 12/25/2021   10:56 AM 10/30/2021    9:25 AM 04/01/2021   10:37 AM  Fall Risk   Falls in the past year? 0 0 0 0 0  Number falls in past yr:  0  0 0  Injury with Fall?  0  0 0  Risk for fall due to : No Fall Risks No Fall Risks No Fall Risks No Fall Risks No Fall Risks  Follow up Falls prevention discussed;Education provided Falls prevention discussed Falls prevention discussed;Education provided;Falls evaluation completed Falls prevention discussed Falls prevention discussed      Functional Status Survey: Is the patient deaf or have difficulty hearing?: No Does the patient have difficulty seeing, even when wearing glasses/contacts?: No Does the patient have difficulty concentrating, remembering, or making decisions?: No Does the patient have difficulty walking or climbing stairs?: No Does the patient have difficulty dressing or bathing?: No Does the patient have difficulty doing errands alone such as visiting a doctor's office or shopping?: No    Assessment & Plan  1. Dyslipidemia associated with type 2 diabetes mellitus (HCC)  - Lipid panel - Microalbumin / creatinine urine ratio - Hemoglobin A1c - rosuvastatin (CRESTOR) 10 MG tablet; Take 1 tablet (10 mg total) by mouth daily.  Dispense: 90 tablet; Refill: 1 - metFORMIN (GLUCOPHAGE) 1000 MG tablet; Take 1 tablet (1,000 mg total) by mouth daily with breakfast.  Dispense: 90 tablet; Refill: 1 - Semaglutide (RYBELSUS) 7 MG  TABS; Take 1 tablet (7 mg total) by mouth daily.  Dispense: 90 tablet; Refill: 0  2. Morbid obesity ([place])  Discussed with the patient the risk posed by an increased BMI. Discussed importance of portion control, calorie counting and at least 150 minutes of physical activity weekly. Avoid sweet beverages and drink more water. Eat at least 6 servings of fruit and vegetables daily    3. OSA (obstructive sleep apnea)  - CBC with Differential/Platelet  4. Seasonal allergic rhinitis, unspecified trigger  Stable  5. Chronic idiopathic constipation  Improved  6. Lower urinary tract symptoms (LUTS)   Refill medication   7. Screening for prostate cancer  PSA  8. Long-term use of high-risk medication  - COMPLETE METABOLIC PANEL WITH GFR - Vitamin B12  9. Onychomycosis of multiple toenails with type 2 diabetes mellitus (HCC)  - metFORMIN (GLUCOPHAGE) 1000 MG tablet; Take 1 tablet (1,000 mg total) by mouth daily with breakfast.  Dispense: 90 tablet; Refill: 1  10. Vitamin D deficiency  - VITAMIN D 25 Hydroxy (Vit-D Deficiency, Fractures)

## 2022-04-06 ENCOUNTER — Ambulatory Visit: Payer: BC Managed Care – PPO | Admitting: Family Medicine

## 2022-04-06 ENCOUNTER — Encounter: Payer: Self-pay | Admitting: Family Medicine

## 2022-04-06 VITALS — BP 126/70 | HR 84 | Temp 98.3°F | Resp 18 | Ht 69.0 in | Wt 292.9 lb

## 2022-04-06 DIAGNOSIS — J302 Other seasonal allergic rhinitis: Secondary | ICD-10-CM | POA: Diagnosis not present

## 2022-04-06 DIAGNOSIS — Z79899 Other long term (current) drug therapy: Secondary | ICD-10-CM | POA: Diagnosis not present

## 2022-04-06 DIAGNOSIS — G4733 Obstructive sleep apnea (adult) (pediatric): Secondary | ICD-10-CM

## 2022-04-06 DIAGNOSIS — E559 Vitamin D deficiency, unspecified: Secondary | ICD-10-CM | POA: Diagnosis not present

## 2022-04-06 DIAGNOSIS — E1169 Type 2 diabetes mellitus with other specified complication: Secondary | ICD-10-CM

## 2022-04-06 DIAGNOSIS — E785 Hyperlipidemia, unspecified: Secondary | ICD-10-CM | POA: Diagnosis not present

## 2022-04-06 DIAGNOSIS — B351 Tinea unguium: Secondary | ICD-10-CM

## 2022-04-06 DIAGNOSIS — R399 Unspecified symptoms and signs involving the genitourinary system: Secondary | ICD-10-CM

## 2022-04-06 DIAGNOSIS — K5904 Chronic idiopathic constipation: Secondary | ICD-10-CM

## 2022-04-06 DIAGNOSIS — Z125 Encounter for screening for malignant neoplasm of prostate: Secondary | ICD-10-CM

## 2022-04-06 MED ORDER — RYBELSUS 7 MG PO TABS: 7.0000 mg | ORAL_TABLET | Freq: Every day | ORAL | 0 refills | Status: DC

## 2022-04-06 MED ORDER — METFORMIN HCL 1000 MG PO TABS: 1000.0000 mg | ORAL_TABLET | Freq: Every day | ORAL | 1 refills | Status: DC

## 2022-04-06 MED ORDER — ROSUVASTATIN CALCIUM 10 MG PO TABS: 10.0000 mg | ORAL_TABLET | Freq: Every day | ORAL | 1 refills | Status: DC

## 2022-04-06 MED ORDER — TAMSULOSIN HCL 0.4 MG PO CAPS: 0.4000 mg | ORAL_CAPSULE | Freq: Every day | ORAL | 3 refills | Status: DC

## 2022-04-07 LAB — COMPLETE METABOLIC PANEL WITH GFR
AG Ratio: 1.5 (calc) (ref 1.0–2.5)
ALT: 21 U/L (ref 9–46)
AST: 22 U/L (ref 10–35)
Albumin: 4.4 g/dL (ref 3.6–5.1)
Alkaline phosphatase (APISO): 96 U/L (ref 35–144)
BUN: 14 mg/dL (ref 7–25)
CO2: 27 mmol/L (ref 20–32)
Calcium: 9.6 mg/dL (ref 8.6–10.3)
Chloride: 105 mmol/L (ref 98–110)
Creat: 1.05 mg/dL (ref 0.70–1.35)
Globulin: 2.9 g/dL (calc) (ref 1.9–3.7)
Glucose, Bld: 128 mg/dL — ABNORMAL HIGH (ref 65–99)
Potassium: 4.4 mmol/L (ref 3.5–5.3)
Sodium: 140 mmol/L (ref 135–146)
Total Bilirubin: 0.5 mg/dL (ref 0.2–1.2)
Total Protein: 7.3 g/dL (ref 6.1–8.1)
eGFR: 80 mL/min/{1.73_m2} (ref >=60)

## 2022-04-07 LAB — VITAMIN B12: Vitamin B-12: 422 pg/mL (ref 200–1100)

## 2022-04-07 LAB — CBC WITH DIFFERENTIAL/PLATELET
Absolute Monocytes: 428 cells/uL (ref 200–950)
Basophils Absolute: 51 cells/uL (ref 0–200)
Basophils Relative: 1.1 %
Eosinophils Absolute: 221 cells/uL (ref 15–500)
Eosinophils Relative: 4.8 %
HCT: 45.7 % (ref 38.5–50.0)
Hemoglobin: 15.9 g/dL (ref 13.2–17.1)
Lymphs Abs: 1509 cells/uL (ref 850–3900)
MCH: 31.2 pg (ref 27.0–33.0)
MCHC: 34.8 g/dL (ref 32.0–36.0)
MCV: 89.6 fL (ref 80.0–100.0)
MPV: 10.5 fL (ref 7.5–12.5)
Monocytes Relative: 9.3 %
Neutro Abs: 2392 cells/uL (ref 1500–7800)
Neutrophils Relative %: 52 %
Platelets: 283 10*3/uL (ref 140–400)
RBC: 5.1 10*6/uL (ref 4.20–5.80)
RDW: 12.7 % (ref 11.0–15.0)
Total Lymphocyte: 32.8 %
WBC: 4.6 10*3/uL (ref 3.8–10.8)

## 2022-04-07 LAB — LIPID PANEL
Cholesterol: 109 mg/dL (ref <200)
HDL: 47 mg/dL (ref >=40)
LDL Cholesterol (Calc): 43 mg/dL (calc)
Non-HDL Cholesterol (Calc): 62 mg/dL (calc) (ref <130)
Total CHOL/HDL Ratio: 2.3 (calc) (ref <5.0)
Triglycerides: 107 mg/dL (ref <150)

## 2022-04-07 LAB — HEMOGLOBIN A1C
Hgb A1c MFr Bld: 6.8 % of total Hgb — ABNORMAL HIGH (ref <5.7)
Mean Plasma Glucose: 148 mg/dL
eAG (mmol/L): 8.2 mmol/L

## 2022-04-07 LAB — MICROALBUMIN / CREATININE URINE RATIO
Creatinine, Urine: 22 mg/dL (ref 20–320)
Microalb Creat Ratio: 14 mcg/mg creat (ref <30)
Microalb, Ur: 0.3 mg/dL

## 2022-04-07 LAB — PSA: PSA: 0.68 ng/mL (ref <=4.00)

## 2022-04-07 LAB — VITAMIN D 25 HYDROXY (VIT D DEFICIENCY, FRACTURES): Vit D, 25-Hydroxy: 34 ng/mL (ref 30–100)

## 2022-04-13 ENCOUNTER — Ambulatory Visit: Payer: BC Managed Care – PPO

## 2022-04-13 DIAGNOSIS — M25532 Pain in left wrist: Secondary | ICD-10-CM

## 2022-04-13 DIAGNOSIS — M6281 Muscle weakness (generalized): Secondary | ICD-10-CM | POA: Diagnosis not present
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
## 2022-04-20 ENCOUNTER — Ambulatory Visit: Payer: BC Managed Care – PPO

## 2022-04-27 ENCOUNTER — Ambulatory Visit: Payer: BC Managed Care – PPO

## 2022-04-30 DIAGNOSIS — G4733 Obstructive sleep apnea (adult) (pediatric): Secondary | ICD-10-CM | POA: Diagnosis not present

## 2022-04-30 NOTE — Progress Notes (Signed)
Name: John York   MRN: [id number]    DOB: [birth year]   Date:05/01/2022       Progress Note  Subjective  Chief Complaint  Follow Up  HPI  Paresthesia right middle finger: he noticed over one month ago, burning or numbness on right middle finger - medially, no weakness.    Morbid Obesity: BMI above 40. He has not been eating as much since he started on Rybelsus January 2024   Diabetes mellitus type 2 : he was diagnosed around 2018 initially diet controlled, he started Metformin last Fall 2021  and was doing well, A1C had  improved however went up to 6.8 % on his last visit, he also wanted to lose weight and we gave him Rybelsus , he states it tastes bad and a couple of episodes of nausea, he has lost 5 lbs in the past few weeks.   Meralgia paresthetica: he states symptoms started over one month ago, burning or numbness on right lateral thigh. 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 Jamal Collin   COLONOSCOPY W/ POLYPECTOMY  2008   26m size sessile non bleeding polyp was found in the sigmoid colon   COLONOSCOPY WITH PROPOFOL N/A 02/24/2017   Procedure: COLONOSCOPY WITH PROPOFOL;  Surgeon: SChristene Lye MD;  Location: [facility] ENDOSCOPY;  Service: Endoscopy;  Laterality:  N/A;   COLONOSCOPY WITH PROPOFOL N/A 03/26/2022   Procedure: COLONOSCOPY WITH PROPOFOL;  Surgeon: VLin Landsman MD;  [facility];  Service: Gastroenterology;  Laterality: N/A;    Family History  Problem Relation Age of Onset   Cancer Sister        breast   Colon cancer Brother    Diabetes Father    Diabetes Mother    Hypertension Mother     Social History   Tobacco Use   Smoking status: Never   Smokeless tobacco: Never  Substance Use Topics   Alcohol use: No    Alcohol/week: 0.0 standard drinks of alcohol     Current Outpatient Medications:    Blood Glucose Monitoring Suppl (CONTOUR NEXT ONE) KIT, 1 each by Does not apply route daily. Please check blood sugar once daily.  May check 1 additional time per day as needed., Disp: 1 kit, Rfl: 0   cholecalciferol (VITAMIN D) 1000 units tablet, Take 1,000 Units daily by mouth., Disp: , Rfl:    fexofenadine (ALLEGRA ALLERGY) 180 MG tablet, Take 1 tablet (180 mg total) by mouth daily., Disp: 90 tablet, Rfl: 1   fluticasone (FLONASE) 50 MCG/ACT nasal spray, Place 2 sprays into both nostrils daily., Disp: 16 g, Rfl: 2   glucose blood test strip, Use as instructed, Disp: 100 each, Rfl: 12   metFORMIN (GLUCOPHAGE) 1000 MG tablet, Take 1 tablet (1,000 mg total) by mouth  daily with breakfast., Disp: 90 tablet, Rfl: 1   rosuvastatin (CRESTOR) 10 MG tablet, Take 1 tablet (10 mg total) by mouth daily., Disp: 90 tablet, Rfl: 1   Semaglutide (RYBELSUS) 7 MG TABS, Take 1 tablet (7 mg total) by mouth daily., Disp: 90 tablet, Rfl: 0   tamsulosin (FLOMAX) 0.4 MG CAPS capsule, Take 1 capsule (0.4 mg total) by mouth daily., Disp: 90 capsule, Rfl: 3  No Known Allergies  I personally reviewed active problem list, medication list, allergies, family history, social history, health maintenance with the patient/caregiver today.   ROS  Ten systems reviewed and is negative except as mentioned in HPI   Objective  Vitals:   05/01/22  1328  BP: 136/70  Pulse: 82  Resp: 16  SpO2: 98%  Weight: 287 lb (130.2 kg)  Height: 5' 9"$  (1.753 m)    Body mass index is 42.38 kg/m.  Physical Exam  Constitutional: Patient appears well-developed and well-nourished. Obese  No distress.  HEENT: head atraumatic, normocephalic, pupils equal and reactive to light, neck supple Cardiovascular: Normal rate, regular rhythm and normal heart sounds.  No murmur heard. No BLE edema. Pulmonary/Chest: Effort normal and breath sounds normal. No respiratory distress. Abdominal: Soft.  There is no tenderness. Muscular skeletal: normal neck and shoulder exam, decrease rom of wrist, negative phalen and tinnel's signs  Psychiatric: Patient has a normal mood and affect. behavior is normal. Judgment and thought content normal.    PHQ2/9:    05/01/2022    1:27 PM 04/06/2022    8:37 AM 03/03/2022    7:54 AM 12/25/2021   10:56 AM 10/30/2021    9:25 AM  Depression screen PHQ 2/9  Decreased Interest 0 0 0 0 0  Down, Depressed, Hopeless 0 0 0 0 0  PHQ - 2 Score 0 0 0 0 0  Altered sleeping 0 0 0 0 0  Tired, decreased energy 0 0 0 0 0  Change in appetite 0 0 0 0 0  Feeling bad or failure about yourself  0 0 0 0 0  Trouble concentrating 0 0 0 0 0  Moving slowly or fidgety/restless 0 0 0 0 0  Suicidal thoughts 0 0 0 0 0  PHQ-9 Score 0 0 0 0 0    phq 9 is negative   Fall Risk:    05/01/2022    1:27 PM 04/06/2022    8:37 AM 03/03/2022    7:54 AM 12/25/2021   10:56 AM 10/30/2021    9:25 AM  Fall Risk   Falls in the past year? 0 0 0 0 0  Number falls in past yr: 0  0  0  Injury with Fall? 0  0  0  Risk for fall due to : No Fall Risks No Fall Risks No Fall Risks No Fall Risks No Fall Risks  Follow up Falls prevention discussed Falls prevention discussed;Education provided Falls prevention discussed Falls prevention discussed;Education provided;Falls evaluation completed Falls prevention discussed    Functional Status Survey: Is the patient  deaf or have difficulty hearing?: No Does the patient have difficulty seeing, even when wearing glasses/contacts?: No Does the patient have difficulty concentrating, remembering, or making decisions?: No Does the patient have difficulty walking or climbing stairs?: No Does the patient have difficulty dressing or bathing?: No Does the patient have difficulty doing errands alone such as visiting a doctor's office or shopping?: No    Assessment & Plan  1. Dyslipidemia associated with type 2 diabetes mellitus ([place])  Continue Rybelsus   2. Meralgia paraesthetica, right  Reassurance given   3. Morbid obesity ([place])  Discussed with the patient the risk posed by an increased BMI. Discussed importance of portion control, calorie counting and at least 150 minutes of physical activity weekly. Avoid sweet beverages and drink more water. Eat at least 6 servings of fruit and vegetables daily     4. Paresthesia of finger  Medial distal right finger, can try a carpal tunnel brace, but unsure if it will help

## 2022-05-01 ENCOUNTER — Ambulatory Visit: Payer: BC Managed Care – PPO | Admitting: Family Medicine

## 2022-05-01 ENCOUNTER — Encounter: Payer: Self-pay | Admitting: Family Medicine

## 2022-05-01 VITALS — BP 136/70 | HR 82 | Resp 16 | Ht 69.0 in | Wt 287.0 lb

## 2022-05-01 DIAGNOSIS — R202 Paresthesia of skin: Secondary | ICD-10-CM

## 2022-05-01 DIAGNOSIS — E1169 Type 2 diabetes mellitus with other specified complication: Secondary | ICD-10-CM

## 2022-05-01 DIAGNOSIS — G5711 Meralgia paresthetica, right lower limb: Secondary | ICD-10-CM | POA: Diagnosis not present

## 2022-05-01 DIAGNOSIS — E785 Hyperlipidemia, unspecified: Secondary | ICD-10-CM

## 2022-05-04 ENCOUNTER — Ambulatory Visit: Payer: BC Managed Care – PPO

## 2022-05-29 DIAGNOSIS — G4733 Obstructive sleep apnea (adult) (pediatric): Secondary | ICD-10-CM | POA: Diagnosis not present

## 2022-06-01 ENCOUNTER — Encounter: Payer: Self-pay | Admitting: Family Medicine

## 2022-06-29 DIAGNOSIS — G4733 Obstructive sleep apnea (adult) (pediatric): Secondary | ICD-10-CM | POA: Diagnosis not present

## 2022-07-01 ENCOUNTER — Ambulatory Visit: Payer: BC Managed Care – PPO | Admitting: Family Medicine

## 2022-07-03 NOTE — Progress Notes (Unsigned)
Name: John York   MRN: [id number]    DOB: [birth year]   Date:07/06/2022       Progress Note  Subjective  Chief Complaint  Follow Up  HPI  OSA: seen by  Dr. Belia Heman in the past and is compliant with CPAP. He tries to sleep with mask  at least 4 hours per night . Unchanged    Morbid Obesity: BMI above 40.   He has not been going to the gym, he is also on Rybelsus since 03/2022 and weight is down from 292 lbs to 272.8 lbs . He is doing well, today he prefers staying on current dose of Rybelsus    LUTS: takes flomax and it helps with his flow , no longer having nocturia every night.     AR: he states a little more congestion due to Spring, using flonase daily now    Tinnitus: he saw Dr. Willeen Cass back in May 22 , symptoms are stable, but if gets worse he states Dr. Willeen Cass ordered MRI and he finally had it done 03/17/2022 and did not show schwannoma but he has mild white matter changes in the frontal lobes . He has a follow up coming up this week with Dr. Willeen Cass    Diabetes mellitus type 2 : he was diagnosed around 2018 initially diet controlled, he started Metformin Fall of 2021, Rybelsus January 2024. A1C down from 6.8 % to 5.6 % since we added Rybelsus. Initially he felt bloated and had nausea, but side effects resolved, he has lost 20 lbs since started on medication, he states feet pain while standing has decreased with weight loss. He has dyslipidemia and is on statin therapy    Dyslipidemia: he is taking Crestor and denies myalgia , LDL was 43   Paresthesia right middle finger: he noticed over one month ago, burning or numbness on right middle finger - medially, no weakness. Unchanged   Meralgia paresthetica: he states symptoms started over one month ago, burning or numbness on right lateral thigh. Discussed with patient   Dark spot on forehead : going on for a long time, girlfriend asked him to get checked out, no pruritus, no change, no redness, smooth, explained it may be  hyperpigmentation for scarring, can try hydrocortisone otc and if no improvement refer to Dermatologist or try hydroquinone   Patient Active Problem List   Diagnosis Date Noted   Polyp of ascending colon 03/26/2022   Polyp of transverse colon 03/26/2022   Dyslipidemia associated with type 2 diabetes mellitus 11/15/2020   Constipation 12/16/2018   Seasonal allergic rhinitis 05/30/2018   Hyperlipidemia associated with type 2 diabetes mellitus 02/08/2018   Vertigo 02/07/2018   OSA (obstructive sleep apnea) 02/07/2018   Tinnitus 06/16/2017   Morbid obesity 09/25/2014   FHx: migraine headaches 09/25/2014   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 Evette Cristal   COLONOSCOPY W/ POLYPECTOMY  2008   7mm size sessile non bleeding polyp was found in the sigmoid colon   COLONOSCOPY WITH PROPOFOL N/A 02/24/2017   Procedure: COLONOSCOPY WITH PROPOFOL;  Surgeon: Kieth Brightly, MD;  Location: [facility] ENDOSCOPY;  Service: Endoscopy;  Laterality: N/A;   COLONOSCOPY WITH PROPOFOL N/A 03/26/2022   Procedure: COLONOSCOPY WITH PROPOFOL;  Surgeon: Toney Reil, MD;  Location: [facility] ENDOSCOPY;  Service: Gastroenterology;  Laterality:  N/A;    Family History  Problem Relation Age of Onset   Cancer Sister        breast   Colon cancer Brother    Diabetes Father    Diabetes Mother    Hypertension Mother     Social History   Tobacco Use   Smoking status: Never   Smokeless tobacco: Never  Substance Use Topics   Alcohol use: No    Alcohol/week: 0.0 standard drinks of alcohol     Current Outpatient Medications:    Blood Glucose Monitoring Suppl (CONTOUR NEXT ONE) KIT, 1 each by Does not apply route daily. Please check blood sugar once daily.  May check 1 additional time per day as needed., Disp: 1 kit, Rfl: 0    cholecalciferol (VITAMIN D) 1000 units tablet, Take 1,000 Units daily by mouth., Disp: , Rfl:    fexofenadine (ALLEGRA ALLERGY) 180 MG tablet, Take 1 tablet (180 mg total) by mouth daily., Disp: 90 tablet, Rfl: 1   fluticasone (FLONASE) 50 MCG/ACT nasal spray, Place 2 sprays into both nostrils daily., Disp: 16 g, Rfl: 2   glucose blood test strip, Use as instructed, Disp: 100 each, Rfl: 12   metFORMIN (GLUCOPHAGE) 1000 MG tablet, Take 1 tablet (1,000 mg total) by mouth daily with breakfast., Disp: 90 tablet, Rfl: 1   rosuvastatin (CRESTOR) 10 MG tablet, Take 1 tablet (10 mg total) by mouth daily., Disp: 90 tablet, Rfl: 1   Semaglutide (RYBELSUS) 7 MG TABS, Take 1 tablet (7 mg total) by mouth daily., Disp: 90 tablet, Rfl: 0   tamsulosin (FLOMAX) 0.4 MG CAPS capsule, Take 1 capsule (0.4 mg total) by mouth daily., Disp: 90 capsule, Rfl: 3  No Known Allergies  I personally reviewed active problem list, medication list, allergies, family history, social history, health maintenance with the patient/caregiver today.   ROS  Constitutional: Negative for fever or weight change.  Respiratory: Negative for cough and shortness of breath.   Cardiovascular: Negative for chest pain or palpitations.  Gastrointestinal: Negative for abdominal pain, no bowel changes.  Musculoskeletal: Negative for gait problem or joint swelling.  Skin: Negative for rash.  Neurological: Negative for dizziness or headache.  No other specific complaints in a complete review of systems (except as listed in HPI above).   Objective  Vitals:   07/06/22 1341  BP: 128/72  Pulse: 95  Resp: 16  Temp: 98.3 F (36.8 C)  TempSrc: Oral  SpO2: 98%  Weight: 272 lb 12.8 oz (123.7 kg)  Height:  (1.753 m)    Body mass index is 40.29 kg/m.  Physical Exam  Constitutional: Patient appears well-developed and well-nourished. Obese  No distress.  HEENT: head atraumatic, normocephalic, pupils equal and reactive to light, neck  supple Cardiovascular: Normal rate, regular rhythm and normal heart sounds.  No murmur heard. No BLE edema. Pulmonary/Chest: Effort normal and breath sounds normal. No respiratory distress. Abdominal: Soft.  There is no tenderness. Psychiatric: Patient has a normal mood and affect. behavior is normal. Judgment and thought content normal.    PHQ2/9:    07/06/2022    1:42 PM 05/01/2022    1:27 PM 04/06/2022    8:37 AM 03/03/2022    7:54 AM 12/25/2021   10:56 AM  Depression screen PHQ 2/9  Decreased Interest 0 0 0 0 0  Down, Depressed, Hopeless 0 0 0 0 0  PHQ - 2 Score 0 0 0 0 0  Altered sleeping 0 0 0 0 0  Tired, decreased  energy 0 0 0 0 0  Change in appetite 0 0 0 0 0  Feeling bad or failure about yourself  0 0 0 0 0  Trouble concentrating 0 0 0 0 0  Moving slowly or fidgety/restless 0 0 0 0 0  Suicidal thoughts 0 0 0 0 0  PHQ-9 Score 0 0 0 0 0    phq 9 is negative   Fall Risk:    07/06/2022    1:42 PM 05/01/2022    1:27 PM 04/06/2022    8:37 AM 03/03/2022    7:54 AM 12/25/2021   10:56 AM  Fall Risk   Falls in the past year? 0 0 0 0 0  Number falls in past yr:  0  0   Injury with Fall?  0  0   Risk for fall due to : No Fall Risks No Fall Risks No Fall Risks No Fall Risks No Fall Risks  Follow up Falls prevention discussed Falls prevention discussed Falls prevention discussed;Education provided Falls prevention discussed Falls prevention discussed;Education provided;Falls evaluation completed      Functional Status Survey: Is the patient deaf or have difficulty hearing?: No Does the patient have difficulty seeing, even when wearing glasses/contacts?: No Does the patient have difficulty concentrating, remembering, or making decisions?: No Does the patient have difficulty walking or climbing stairs?: No Does the patient have difficulty dressing or bathing?: No Does the patient have difficulty doing errands alone such as visiting a doctor's office or shopping?:  No    Assessment & Plan  1. Dyslipidemia associated with type 2 diabetes mellitus  - POCT HgB A1C - Semaglutide (RYBELSUS) 7 MG TABS; Take 1 tablet (7 mg total) by mouth daily.  Dispense: 90 tablet; Refill: 0  2. Paresthesia of finger   3. OSA (obstructive sleep apnea)  Continue CPAP   4. Meralgia paraesthetica, right  We will give him patient education and exercises   5. Morbid obesity  BMI over 40, he needs to go back to the gym, losing weight since started on Rybelsus  6. Vitamin D deficiency  Continue supplementation   7. Lower urinary tract symptoms (LUTS)  stable  8. Hyperlipidemia associated with type 2 diabetes mellitus  On statin therapy  9. Seasonal allergic rhinitis, unspecified trigger

## 2022-07-06 ENCOUNTER — Encounter: Payer: Self-pay | Admitting: Family Medicine

## 2022-07-06 ENCOUNTER — Ambulatory Visit: Payer: BC Managed Care – PPO | Admitting: Family Medicine

## 2022-07-06 VITALS — BP 128/72 | HR 95 | Temp 98.3°F | Resp 16 | Ht 69.0 in | Wt 272.8 lb

## 2022-07-06 DIAGNOSIS — R399 Unspecified symptoms and signs involving the genitourinary system: Secondary | ICD-10-CM

## 2022-07-06 DIAGNOSIS — R202 Paresthesia of skin: Secondary | ICD-10-CM | POA: Diagnosis not present

## 2022-07-06 DIAGNOSIS — E1169 Type 2 diabetes mellitus with other specified complication: Secondary | ICD-10-CM

## 2022-07-06 DIAGNOSIS — E785 Hyperlipidemia, unspecified: Secondary | ICD-10-CM

## 2022-07-06 DIAGNOSIS — G4733 Obstructive sleep apnea (adult) (pediatric): Secondary | ICD-10-CM

## 2022-07-06 DIAGNOSIS — J302 Other seasonal allergic rhinitis: Secondary | ICD-10-CM

## 2022-07-06 DIAGNOSIS — G5711 Meralgia paresthetica, right lower limb: Secondary | ICD-10-CM

## 2022-07-06 DIAGNOSIS — E559 Vitamin D deficiency, unspecified: Secondary | ICD-10-CM

## 2022-07-06 LAB — POCT GLYCOSYLATED HEMOGLOBIN (HGB A1C): Hemoglobin A1C: 5.6 % (ref 4.0–5.6)

## 2022-07-06 MED ORDER — RYBELSUS 7 MG PO TABS: 7.0000 mg | ORAL_TABLET | Freq: Every day | ORAL | 0 refills | Status: DC

## 2022-07-09 DIAGNOSIS — H903 Sensorineural hearing loss, bilateral: Secondary | ICD-10-CM | POA: Diagnosis not present

## 2022-07-09 DIAGNOSIS — J301 Allergic rhinitis due to pollen: Secondary | ICD-10-CM | POA: Diagnosis not present

## 2022-07-09 DIAGNOSIS — H9042 Sensorineural hearing loss, unilateral, left ear, with unrestricted hearing on the contralateral side: Secondary | ICD-10-CM | POA: Diagnosis not present

## 2022-07-09 DIAGNOSIS — H9313 Tinnitus, bilateral: Secondary | ICD-10-CM | POA: Diagnosis not present

## 2022-07-09 DIAGNOSIS — R42 Dizziness and giddiness: Secondary | ICD-10-CM | POA: Diagnosis not present

## 2022-07-29 DIAGNOSIS — G4733 Obstructive sleep apnea (adult) (pediatric): Secondary | ICD-10-CM | POA: Diagnosis not present

## 2022-08-05 ENCOUNTER — Ambulatory Visit: Payer: BC Managed Care – PPO | Admitting: Family Medicine

## 2022-08-09 ENCOUNTER — Other Ambulatory Visit: Payer: Self-pay | Admitting: Family Medicine

## 2022-08-09 DIAGNOSIS — E1169 Type 2 diabetes mellitus with other specified complication: Secondary | ICD-10-CM

## 2022-08-29 DIAGNOSIS — G4733 Obstructive sleep apnea (adult) (pediatric): Secondary | ICD-10-CM | POA: Diagnosis not present

## 2022-09-28 ENCOUNTER — Encounter: Payer: Self-pay | Admitting: Family Medicine
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
## 2022-10-05 ENCOUNTER — Encounter: Payer: Self-pay | Admitting: Family Medicine

## 2022-10-05 ENCOUNTER — Ambulatory Visit: Payer: BC Managed Care – PPO | Admitting: Family Medicine

## 2022-10-05 VITALS — BP 128/72 | HR 77 | Temp 97.9°F | Resp 16 | Ht 69.0 in | Wt 266.3 lb

## 2022-10-05 DIAGNOSIS — E785 Hyperlipidemia, unspecified: Secondary | ICD-10-CM | POA: Diagnosis not present

## 2022-10-05 DIAGNOSIS — E1169 Type 2 diabetes mellitus with other specified complication: Secondary | ICD-10-CM | POA: Diagnosis not present

## 2022-10-05 DIAGNOSIS — E559 Vitamin D deficiency, unspecified: Secondary | ICD-10-CM

## 2022-10-05 DIAGNOSIS — I739 Peripheral vascular disease, unspecified: Secondary | ICD-10-CM | POA: Diagnosis not present

## 2022-10-05 DIAGNOSIS — G4733 Obstructive sleep apnea (adult) (pediatric): Secondary | ICD-10-CM | POA: Diagnosis not present

## 2022-10-05 DIAGNOSIS — G43109 Migraine with aura, not intractable, without status migrainosus: Secondary | ICD-10-CM

## 2022-10-05 LAB — POCT GLYCOSYLATED HEMOGLOBIN (HGB A1C): Hemoglobin A1C: 5.7 % — AB (ref 4.0–5.6)

## 2022-10-05 MED ORDER — UBRELVY 100 MG PO TABS: 1.0000 | ORAL_TABLET | Freq: Every day | ORAL | 0 refills | Status: DC | PRN

## 2022-10-05 MED ORDER — RYBELSUS 7 MG PO TABS: 7.0000 mg | ORAL_TABLET | Freq: Every day | ORAL | 1 refills | Status: DC

## 2022-10-05 MED ORDER — ROSUVASTATIN CALCIUM 10 MG PO TABS: 10.0000 mg | ORAL_TABLET | Freq: Every day | ORAL | 1 refills | Status: DC

## 2022-10-05 MED ORDER — METFORMIN HCL 1000 MG PO TABS: 1000.0000 mg | ORAL_TABLET | Freq: Every day | ORAL | 1 refills | Status: DC

## 2022-11-05 DIAGNOSIS — G4733 Obstructive sleep apnea (adult) (pediatric): Secondary | ICD-10-CM | POA: Diagnosis not present

## 2022-12-06 DIAGNOSIS — G4733 Obstructive sleep apnea (adult) (pediatric): Secondary | ICD-10-CM | POA: Diagnosis not present

## 2022-12-14 DIAGNOSIS — E119 Type 2 diabetes mellitus without complications: Secondary | ICD-10-CM | POA: Diagnosis not present

## 2022-12-14 LAB — HM DIABETES EYE EXAM

## 2022-12-31 NOTE — Progress Notes (Signed)
Name: John York   MRN: [id number]    DOB: [birth year]   Date:01/01/2023       Progress Note  Subjective  Chief Complaint  Annual Exam  HPI  Patient presents for annual CPE.  IPSS Questionnaire (AUA-7): Over the past month.   1)  How often have you had a sensation of not emptying your bladder completely after you finish urinating?  0 - Not at all  2)  How often have you had to urinate again less than two hours after you finished urinating? 1 - Less than 1 time in 5  3)  How often have you found you stopped and started again several times when you urinated?  0 - Not at all  4) How difficult have you found it to postpone urination?  2 - Less than half the time  5) How often have you had a weak urinary stream?  0 - Not at all  6) How often have you had to push or strain to begin urination?  0 - Not at all  7) How many times did you most typically get up to urinate from the time you went to bed until the time you got up in the morning?  1 - 1 time  Total score:  0-7 mildly symptomatic   8-19 moderately symptomatic   20-35 severely symptomatic     Diet: eating smaller portions, avoiding sweets Exercise: discussed 150 minutes per week  Last Dental Exam: up to date  Last Eye Exam: up to date  Depression: phq 9 is negative    01/01/2023    8:03 AM 01/01/2023    7:58 AM 10/05/2022    3:08 PM 07/06/2022    1:42 PM 05/01/2022    1:27 PM  Depression screen PHQ 2/9  Decreased Interest 0 0 0 0 0  Down, Depressed, Hopeless 0 0 0 0 0  PHQ - 2 Score 0 0 0 0 0  Altered sleeping 1  0 0 0  Tired, decreased energy 1  0 0 0  Change in appetite 0  0 0 0  Feeling bad or failure about yourself  0  0 0 0  Trouble concentrating 1  0 0 0  Moving slowly or fidgety/restless 0  0 0 0  Suicidal thoughts 0  0 0 0  PHQ-9 Score 3  0 0 0  Difficult doing work/chores Not difficult at all  Not difficult at all      Hypertension:  BP Readings from Last 3 Encounters:  01/01/23 126/70  10/05/22  128/72  07/06/22 128/72    Obesity: Wt Readings from Last 3 Encounters:  01/01/23 268 lb 9.6 oz (121.8 kg)  10/05/22 266 lb 4.8 oz (120.8 kg)  07/06/22 272 lb 12.8 oz (123.7 kg)   BMI Readings from Last 3 Encounters:  01/01/23 39.67 kg/m  10/05/22 39.33 kg/m  07/06/22 40.29 kg/m     Lipids:  Lab Results  Component Value Date   CHOL 109 04/06/2022   CHOL 102 04/04/2021   CHOL 105 04/09/2020   Lab Results  Component Value Date   HDL 47 04/06/2022   HDL 47 04/04/2021   HDL 44 04/09/2020   Lab Results  Component Value Date   LDLCALC 43 04/06/2022   LDLCALC 41 04/04/2021   LDLCALC 47 04/09/2020   Lab Results  Component Value Date   TRIG 107 04/06/2022   TRIG 66 04/04/2021   TRIG 64 04/09/2020   Lab Results  Component Value  Date   CHOLHDL 2.3 04/06/2022   CHOLHDL 2.2 04/04/2021   CHOLHDL 2.4 04/09/2020   No results found for: "LDLDIRECT" Glucose:  Glucose  Date Value Ref Range Status  04/04/2021 126 (H) 70 - 99 mg/dL Final  96/06/5407 811 (H) 65 - 99 mg/dL Final  91/47/8295 94 65 - 99 mg/dL Final   Glucose, Bld  Date Value Ref Range Status  04/06/2022 128 (H) 65 - 99 mg/dL Final    Comment:    .            Fasting reference interval . For someone without known diabetes, a glucose value >125 mg/dL indicates that they may have diabetes and this should be confirmed with a follow-up test. .   02/07/2018 87 65 - 139 mg/dL Final    Comment:    .        Non-fasting reference interval .    Glucose-Capillary  Date Value Ref Range Status  03/26/2022 128 (H) 70 - 99 mg/dL Final    Comment:    Glucose reference range applies only to samples taken after fasting for at least 8 hours.    Flowsheet Row Office Visit from 01/01/2023 in [facility]  AUDIT-C Score 1       Single STD testing and prevention (HIV/chl/gon/syphilis): 02/07/18 Sexual history:  Hep C Screening: 02/07/18  Skin cancer: Discussed monitoring for  atypical lesions Colorectal cancer: 03/26/22 Prostate cancer:   Lab Results  Component Value Date   PSA 0.68 04/06/2022   PSA 0.6 02/07/2018     Lung cancer:  Low Dose CT Chest recommended if Age 62-80 years, 30 pack-year currently smoking OR have quit w/in 15years. Patient  is not  a candidate for screening   AAA: The USPSTF recommends one-time screening with ultrasonography in men ages 80 to 75 years who have ever smoked. Patient  is not  a candidate for screening  ECG:  1/8/9  Vaccines:   Tdap: up to date Shingrix: up to date Pneumonia: up to date  Flu: due today  COVID-19: up to date  Advanced Care Planning: A voluntary discussion about advance care planning including the explanation and discussion of advance directives.  Discussed health care proxy and Living will, and the patient was able to identify a health care proxy as daughter - Vanna [place].  Patient does not have a living will and power of attorney of health care   Patient Active Problem List   Diagnosis Date Noted   Polyp of ascending colon 03/26/2022   Polyp of transverse colon 03/26/2022   Dyslipidemia associated with type 2 diabetes mellitus (HCC) 11/15/2020   Constipation 12/16/2018   Seasonal allergic rhinitis 05/30/2018   Hyperlipidemia associated with type 2 diabetes mellitus (HCC) 02/08/2018   Vertigo 02/07/2018   OSA (obstructive sleep apnea) 02/07/2018   Tinnitus 06/16/2017   Morbid obesity (HCC) 09/25/2014   FHx: migraine headaches 09/25/2014   Calcific tendinitis 03/21/2014   Family history of malignant neoplasm of gastrointestinal tract    History of colonic polyps     Past Surgical History:  Procedure Laterality Date   COLONOSCOPY  02/03/2012   Dr. Evette Cristal   COLONOSCOPY W/ POLYPECTOMY  2008   7mm size sessile non bleeding polyp was found in the sigmoid colon   COLONOSCOPY WITH PROPOFOL N/A 02/24/2017   Procedure: COLONOSCOPY WITH PROPOFOL;  Surgeon: Kieth Brightly, MD;  Location:  [facility] ENDOSCOPY;  Service: Endoscopy;  Laterality: N/A;   COLONOSCOPY WITH PROPOFOL N/A  03/26/2022   Procedure: COLONOSCOPY WITH PROPOFOL;  Surgeon: Toney Reil, MD;  Location: [facility] ENDOSCOPY;  Service: Gastroenterology;  Laterality: N/A;    Family History  Problem Relation Age of Onset   Cancer Sister        breast   Colon cancer Brother    Diabetes Father    Diabetes Mother    Hypertension Mother     Social History   Socioeconomic History   Marital status: Single    Spouse name: Not on file   Number of children: 2   Years of education: Not on file   Highest education level: Not on file  Occupational History    Comment: [employer]  Tobacco Use   Smoking status: Never   Smokeless tobacco: Never  Vaping Use   Vaping status: Never Used  Substance and Sexual Activity   Alcohol use: No    Alcohol/week: 0.0 standard drinks of alcohol   Drug use: No   Sexual activity: Not Currently  Other Topics Concern   Not on file  Social History Narrative   Not on file   Social Determinants of Health   Financial Resource Strain: Low Risk  (01/01/2023)   Overall Financial Resource Strain (CARDIA)    Difficulty of Paying Living Expenses: Not hard at all  Food Insecurity: No Food Insecurity (01/01/2023)   Hunger Vital Sign    Worried About Running Out of Food in the Last Year: Never true    Ran Out of Food in the Last Year: Never true  Transportation Needs: No Transportation Needs (01/01/2023)   PRAPARE - Administrator, Civil Service (Medical): No    Lack of Transportation (Non-Medical): No  Physical Activity: Insufficiently Active (01/01/2023)   Exercise Vital Sign    Days of Exercise per Week: 1 day    Minutes of Exercise per Session: 50 min  Stress: Stress Concern Present (01/01/2023)   [employer] of Occupational Health - Occupational Stress Questionnaire    Feeling of Stress : Rather much  Social Connections: Socially Integrated  (01/01/2023)   Social Connection and Isolation Panel [NHANES]    Frequency of Communication with Friends and Family: Twice a week    Frequency of Social Gatherings with Friends and Family: Twice a week    Attends Religious Services: More than 4 times per year    Active Member of [employer] or Organizations: Yes    Attends Banker Meetings: More than 4 times per year    Marital Status: Living with partner  Intimate Partner Violence: Not At Risk (01/01/2023)   Humiliation, Afraid, Rape, and Kick questionnaire    Fear of Current or Ex-Partner: No    Emotionally Abused: No    Physically Abused: No    Sexually Abused: No     Current Outpatient Medications:    Blood Glucose Monitoring Suppl (CONTOUR NEXT ONE) KIT, 1 each by Does not apply route daily. Please check blood sugar once daily.  May check 1 additional time per day as needed., Disp: 1 kit, Rfl: 0   cholecalciferol (VITAMIN D) 1000 units tablet, Take 1,000 Units daily by mouth., Disp: , Rfl:    fexofenadine (ALLEGRA ALLERGY) 180 MG tablet, Take 1 tablet (180 mg total) by mouth daily., Disp: 90 tablet, Rfl: 1   fluticasone (FLONASE) 50 MCG/ACT nasal spray, Place 2 sprays into both nostrils daily., Disp: 16 g, Rfl: 2   glucose blood test strip, Use as instructed, Disp: 100 each, Rfl:  12   metFORMIN (GLUCOPHAGE) 1000 MG tablet, Take 1 tablet (1,000 mg total) by mouth daily with breakfast., Disp: 90 tablet, Rfl: 1   rosuvastatin (CRESTOR) 10 MG tablet, Take 1 tablet (10 mg total) by mouth daily., Disp: 90 tablet, Rfl: 1   Semaglutide (RYBELSUS) 7 MG TABS, Take 1 tablet (7 mg total) by mouth daily., Disp: 90 tablet, Rfl: 1   Ubrogepant (UBRELVY) 100 MG TABS, Take 1 tablet (100 mg total) by mouth daily as needed. (Patient not taking: Reported on 01/01/2023), Disp: 30 tablet, Rfl: 0  No Known Allergies   ROS  Constitutional: Negative for fever, positive for  weight change.  Respiratory: Negative for cough and shortness of breath.    Cardiovascular: Negative for chest pain or palpitations.  Gastrointestinal: Negative for abdominal pain, no bowel changes.  Musculoskeletal: Negative for gait problem or joint swelling.  Skin: Negative for rash.  Neurological: Negative for dizziness or headache.  No other specific complaints in a complete review of systems (except as listed in HPI above).    Objective  Vitals:   01/01/23 0810  BP: 126/70  Pulse: 76  Resp: 18  Temp: 98.3 F (36.8 C)  TempSrc: Oral  SpO2: 98%  Weight: 268 lb 9.6 oz (121.8 kg)  Height: 5\' 9"  (1.753 m)    Body mass index is 39.67 kg/m.  Physical Exam  Constitutional: Patient appears well-developed and well-nourished. No distress.  HENT: Head: Normocephalic and atraumatic. Ears: B TMs ok, no erythema or effusion; Nose: Nose normal. Mouth/Throat: Oropharynx is clear and moist. No oropharyngeal exudate.  Eyes: Conjunctivae and EOM are normal. Pupils are equal, round, and reactive to light. No scleral icterus.  Neck: Normal range of motion. Neck supple. No JVD present. No thyromegaly present.  Cardiovascular: Normal rate, regular rhythm and normal heart sounds.  No murmur heard. No BLE edema. Pulmonary/Chest: Effort normal and breath sounds normal. No respiratory distress. Abdominal: Soft. Bowel sounds are normal, no distension. There is no tenderness. no masses MALE GENITALIA: Normal descended testes bilaterally, no  hydrocele on right side, no hernias, no lesions, no discharge RECTAL: not done  Musculoskeletal: Normal range of motion, no joint effusions. No gross deformities Neurological: he is alert and oriented to person, place, and time. No cranial nerve deficit. Coordination, balance, strength, speech and gait are normal.  Skin: Skin is warm and dry. No rash noted. No erythema. Skin tags  Psychiatric: Patient has a normal mood and affect. behavior is normal. Judgment and thought content normal.   Diabetic Foot Exam - Simple   Simple Foot  Form Visual Inspection See comments: Yes Sensation Testing Intact to touch and monofilament testing bilaterally: Yes Pulse Check Posterior Tibialis and Dorsalis pulse intact bilaterally: Yes Comments Onychomycosis great toes does not want to be treated       Recent Results (from the past 2160 hour(s))  POCT HgB A1C     Status: Abnormal   Collection Time: 10/05/22  3:19 PM  Result Value Ref Range   Hemoglobin A1C 5.7 (A) 4.0 - 5.6 %   HbA1c POC (<> result, manual entry)     HbA1c, POC (prediabetic range)     HbA1c, POC (controlled diabetic range)    HM DIABETES EYE EXAM     Status: None   Collection Time: 12/14/22 11:28 AM  Result Value Ref Range   HM Diabetic Eye Exam No Retinopathy No Retinopathy    Comment: ABSTRACTED BY HIM     Fall Risk:  01/01/2023    8:03 AM 10/05/2022    3:08 PM 07/06/2022    1:42 PM 05/01/2022    1:27 PM 04/06/2022    8:37 AM  Fall Risk   Falls in the past year? 0 0 0 0 0  Number falls in past yr:  0  0   Injury with Fall?  0  0   Risk for fall due to : No Fall Risks No Fall Risks No Fall Risks No Fall Risks No Fall Risks  Follow up Falls prevention discussed;Education provided;Falls evaluation completed Falls prevention discussed;Education provided;Falls evaluation completed Falls prevention discussed Falls prevention discussed Falls prevention discussed;Education provided     Functional Status Survey: Is the patient deaf or have difficulty hearing?: No Does the patient have difficulty seeing, even when wearing glasses/contacts?: No Does the patient have difficulty concentrating, remembering, or making decisions?: No Does the patient have difficulty walking or climbing stairs?: No Does the patient have difficulty dressing or bathing?: No Does the patient have difficulty doing errands alone such as visiting a doctor's office or shopping?: No    Assessment & Plan  1. Well adult exam   2. Need for immunization against influenza  - Flu  vaccine trivalent PF, 6mos and older(Flulaval,Afluria,Fluarix,Fluzone)    -Prostate cancer screening and PSA options (with potential risks and benefits of testing vs not testing) were discussed along with recent recs/guidelines. -USPSTF grade A and B recommendations reviewed with patient; age-appropriate recommendations, preventive care, screening tests, etc discussed and encouraged; healthy living encouraged; see AVS for patient education given to patient -Discussed importance of 150 minutes of physical activity weekly, eat two servings of fish weekly, eat one serving of tree nuts ( cashews, pistachios, pecans, almonds.Marland Kitchen) every other day, eat 6 servings of fruit/vegetables daily and drink plenty of water and avoid sweet beverages.  -Reviewed Health Maintenance: yes

## 2023-01-01 ENCOUNTER — Encounter: Payer: Self-pay | Admitting: Family Medicine

## 2023-01-01 ENCOUNTER — Ambulatory Visit: Payer: BC Managed Care – PPO | Admitting: Family Medicine

## 2023-01-01 VITALS — BP 124/72 | HR 76 | Temp 98.3°F | Resp 18 | Ht 69.0 in | Wt 268.6 lb

## 2023-01-01 DIAGNOSIS — Z0000 Encounter for general adult medical examination without abnormal findings: Secondary | ICD-10-CM | POA: Diagnosis not present

## 2023-01-01 DIAGNOSIS — Z23 Encounter for immunization: Secondary | ICD-10-CM | POA: Diagnosis not present

## 2023-01-11 ENCOUNTER — Encounter: Payer: BC Managed Care – PPO | Admitting: Family Medicine

## 2023-01-14 DIAGNOSIS — G4733 Obstructive sleep apnea (adult) (pediatric): Secondary | ICD-10-CM | POA: Diagnosis not present

## 2023-01-18 NOTE — Transitions of Care (Post Inpatient/ED Visit) (Signed)
   01/18/2023  Name: John York MRN: [id number] DOB: 15-Feb-1961  Today's TOC FU Call Status: Today's TOC FU Call Status:: Unsuccessful Call (1st Attempt) Unsuccessful Call (1st Attempt) Date: 01/18/23  Attempted to reach the patient regarding the most recent Inpatient/ED visit.  Follow Up Plan: Additional outreach attempts will be made to reach the patient to complete the Transitions of Care (Post Inpatient/ED visit) call.    Susa Loffler , BSN, RN Care Management Coordinator [place]   [facility] [email or web address] Direct Dial: [phone]

## 2023-01-19 NOTE — Addendum Note (Signed)
Addended by: Johnnette Barrios on: 01/19/2023 01:07 PM   Modules accepted: Orders

## 2023-01-19 NOTE — Transitions of Care (Post Inpatient/ED Visit) (Signed)
   01/19/2023  Name: John York MRN: [id number] DOB: [birth year]  Today's TOC FU Call Status: Today's TOC FU Call Status:: Successful TOC FU Call Completed Unsuccessful Call (1st Attempt) Date: 01/18/23 [facility] FU Call Complete Date: 01/19/23 Patient's Name and Date of Birth confirmed.   Medications Reviewed Today: Medications Reviewed Today   Medications were not reviewed in this encounter   This patient flagged in CM report for recent discharge from [facility] Outreach attempts related to recent discharge Spoke with patient, he stated he noted the call 11/4. He answered today, he states he was not in [place], He was not in the hospital. He has had this error occur previously and believes another [name] lives locally. He had issues at a DDS office which believed his HX was linked to another person with same name.  Will forward info to [facility]. Patient reiterated he was  not in the hospital, He was not in [place] and no no issues at this time recently seen by PCP    No further Outreach will be completed     Susa Loffler , BSN, RN Care Management Coordinator [place]   [facility] [email or web address] Direct Dial: [phone]

## 2023-01-19 NOTE — Telephone Encounter (Signed)
This encounter was created in error - please disregard.

## 2023-01-19 NOTE — Addendum Note (Signed)
Addended by: Johnnette Barrios on: 01/19/2023 12:12 PM   Modules accepted: Orders, Level of Service

## 2023-01-21 ENCOUNTER — Encounter: Payer: Self-pay | Admitting: Family Medicine

## 2023-02-13 DIAGNOSIS — G4733 Obstructive sleep apnea (adult) (pediatric): Secondary | ICD-10-CM | POA: Diagnosis not present

## 2023-02-24 ENCOUNTER — Telehealth: Payer: Self-pay | Admitting: Family Medicine

## 2023-02-24 ENCOUNTER — Other Ambulatory Visit: Payer: Self-pay | Admitting: Family Medicine

## 2023-02-24 DIAGNOSIS — E559 Vitamin D deficiency, unspecified: Secondary | ICD-10-CM

## 2023-02-24 DIAGNOSIS — E785 Hyperlipidemia, unspecified: Secondary | ICD-10-CM

## 2023-02-24 DIAGNOSIS — Z79899 Other long term (current) drug therapy: Secondary | ICD-10-CM

## 2023-02-24 NOTE — Telephone Encounter (Signed)
Pt would like labs ordered to have them done prior appt. Please advice

## 2023-02-24 NOTE — Telephone Encounter (Unsigned)
Copied from CRM [id number]. Topic: General - Other >> Feb 24, 2023 11:02 AM Macon Large wrote: Reason for CRM: Pt requests that the lab order be faxed to [facility] at [phone]

## 2023-02-25 NOTE — Telephone Encounter (Signed)
Labs faxed

## 2023-02-26 ENCOUNTER — Other Ambulatory Visit: Payer: Self-pay

## 2023-02-26 DIAGNOSIS — E559 Vitamin D deficiency, unspecified: Secondary | ICD-10-CM

## 2023-02-26 DIAGNOSIS — Z79899 Other long term (current) drug therapy: Secondary | ICD-10-CM

## 2023-02-26 DIAGNOSIS — E1159 Type 2 diabetes mellitus with other circulatory complications: Secondary | ICD-10-CM

## 2023-02-26 DIAGNOSIS — E1169 Type 2 diabetes mellitus with other specified complication: Secondary | ICD-10-CM

## 2023-02-27 LAB — LIPID PANEL

## 2023-03-01 ENCOUNTER — Ambulatory Visit: Payer: BC Managed Care – PPO | Admitting: Family Medicine

## 2023-03-01 ENCOUNTER — Encounter: Payer: Self-pay | Admitting: Family Medicine

## 2023-03-01 VITALS — BP 128/74 | HR 73 | Temp 98.1°F | Resp 16 | Ht 69.0 in | Wt 269.3 lb

## 2023-03-01 DIAGNOSIS — G4733 Obstructive sleep apnea (adult) (pediatric): Secondary | ICD-10-CM

## 2023-03-01 DIAGNOSIS — E785 Hyperlipidemia, unspecified: Secondary | ICD-10-CM

## 2023-03-01 DIAGNOSIS — J302 Other seasonal allergic rhinitis: Secondary | ICD-10-CM

## 2023-03-01 DIAGNOSIS — I739 Peripheral vascular disease, unspecified: Secondary | ICD-10-CM

## 2023-03-01 DIAGNOSIS — R9431 Abnormal electrocardiogram [ECG] [EKG]: Secondary | ICD-10-CM

## 2023-03-01 DIAGNOSIS — E1169 Type 2 diabetes mellitus with other specified complication: Secondary | ICD-10-CM | POA: Diagnosis not present

## 2023-03-01 DIAGNOSIS — G43109 Migraine with aura, not intractable, without status migrainosus: Secondary | ICD-10-CM

## 2023-03-01 DIAGNOSIS — Z23 Encounter for immunization: Secondary | ICD-10-CM

## 2023-03-01 DIAGNOSIS — F0781 Postconcussional syndrome: Secondary | ICD-10-CM

## 2023-03-01 DIAGNOSIS — T7840XA Allergy, unspecified, initial encounter: Secondary | ICD-10-CM

## 2023-03-01 DIAGNOSIS — R42 Dizziness and giddiness: Secondary | ICD-10-CM

## 2023-03-01 DIAGNOSIS — Z6839 Body mass index (BMI) 39.0-39.9, adult: Secondary | ICD-10-CM

## 2023-03-01 DIAGNOSIS — R258 Other abnormal involuntary movements: Secondary | ICD-10-CM

## 2023-03-01 DIAGNOSIS — R6889 Other general symptoms and signs: Secondary | ICD-10-CM

## 2023-03-01 LAB — COMPREHENSIVE METABOLIC PANEL
ALT: 24 IU/L (ref 0–44)
AST: 33 IU/L (ref 0–40)
Albumin: 4.4 g/dL (ref 3.9–4.9)
Alkaline Phosphatase: 109 IU/L (ref 44–121)
BUN/Creatinine Ratio: 13 (ref 10–24)
BUN: 15 mg/dL (ref 8–27)
Bilirubin Total: 0.5 mg/dL (ref 0.0–1.2)
CO2: 23 mmol/L (ref 20–29)
Calcium: 9.4 mg/dL (ref 8.6–10.2)
Chloride: 103 mmol/L (ref 96–106)
Creatinine, Ser: 1.13 mg/dL (ref 0.76–1.27)
Globulin, Total: 2.7 g/dL (ref 1.5–4.5)
Glucose: 98 mg/dL (ref 70–99)
Potassium: 4.4 mmol/L (ref 3.5–5.2)
Sodium: 141 mmol/L (ref 134–144)
Total Protein: 7.1 g/dL (ref 6.0–8.5)
eGFR: 73 mL/min/{1.73_m2} (ref >59)

## 2023-03-01 LAB — CBC WITH DIFFERENTIAL/PLATELET
Basophils Absolute: 0 10*3/uL (ref 0.0–0.2)
Basos: 0 %
EOS (ABSOLUTE): 0.2 10*3/uL (ref 0.0–0.4)
Eos: 4 %
Hematocrit: 48 % (ref 37.5–51.0)
Hemoglobin: 15.8 g/dL (ref 13.0–17.7)
Immature Grans (Abs): 0 10*3/uL (ref 0.0–0.1)
Immature Granulocytes: 1 %
Lymphocytes Absolute: 1.6 10*3/uL (ref 0.7–3.1)
Lymphs: 34 %
MCH: 30.5 pg (ref 26.6–33.0)
MCHC: 32.9 g/dL (ref 31.5–35.7)
MCV: 93 fL (ref 79–97)
Monocytes Absolute: 0.5 10*3/uL (ref 0.1–0.9)
Monocytes: 11 %
Neutrophils Absolute: 2.2 10*3/uL (ref 1.4–7.0)
Neutrophils: 50 %
Platelets: 305 10*3/uL (ref 150–450)
RBC: 5.18 x10E6/uL (ref 4.14–5.80)
RDW: 12.8 % (ref 11.6–15.4)
WBC: 4.6 10*3/uL (ref 3.4–10.8)

## 2023-03-01 LAB — LIPID PANEL
Cholesterol, Total: 103 mg/dL (ref 100–199)
HDL: 46 mg/dL (ref >39)
LDL CALC COMMENT:: 2.2 ratio (ref 0.0–5.0)
LDL Chol Calc (NIH): 44 mg/dL (ref 0–99)
Triglycerides: 54 mg/dL (ref 0–149)
VLDL Cholesterol Cal: 13 mg/dL (ref 5–40)

## 2023-03-01 LAB — MICROALBUMIN / CREATININE URINE RATIO
Creatinine, Urine: 152.1 mg/dL
Microalb/Creat Ratio: 24 mg/g{creat} (ref 0–29)
Microalbumin, Urine: 36.6 ug/mL

## 2023-03-01 LAB — HEMOGLOBIN A1C
Est. average glucose Bld gHb Est-mCnc: 117 mg/dL
Hgb A1c MFr Bld: 5.7 % — ABNORMAL HIGH (ref 4.8–5.6)

## 2023-03-01 LAB — VITAMIN D 25 HYDROXY (VIT D DEFICIENCY, FRACTURES): Vit D, 25-Hydroxy: 43 ng/mL (ref 30.0–100.0)

## 2023-03-01 LAB — B12 AND FOLATE PANEL
Folate: 11 ng/mL (ref >3.0)
Vitamin B-12: 392 pg/mL (ref 232–1245)

## 2023-03-01 MED ORDER — FLUTICASONE PROPIONATE 50 MCG/ACT NA SUSP: 2.0000 | Freq: Every day | NASAL | 2 refills | Status: DC

## 2023-03-01 MED ORDER — ROSUVASTATIN CALCIUM 10 MG PO TABS: 10.0000 mg | ORAL_TABLET | Freq: Every day | ORAL | 1 refills | Status: DC

## 2023-03-01 MED ORDER — FEXOFENADINE HCL 180 MG PO TABS: 180.0000 mg | ORAL_TABLET | Freq: Every day | ORAL | 1 refills | Status: DC

## 2023-03-01 MED ORDER — RYBELSUS 14 MG PO TABS: 14.0000 mg | ORAL_TABLET | Freq: Every day | ORAL | 1 refills | Status: DC

## 2023-03-01 MED ORDER — METFORMIN HCL 1000 MG PO TABS: 1000.0000 mg | ORAL_TABLET | Freq: Every day | ORAL | 1 refills | Status: DC
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
## 2023-03-02 ENCOUNTER — Telehealth: Payer: Self-pay | Admitting: Family Medicine

## 2023-03-02 NOTE — Telephone Encounter (Unsigned)
Copied from CRM [id number]. Topic: General - Other >> Mar 02, 2023 12:16 PM Marlow Baars wrote: Reason for CRM: Lupita Leash from [facility] called in stating she received the referral and records but she still needs the abnormal EKG that is referenced. She has tried to contact the patient to schedule but needs all the notes for the doctor to see. Please assist further by faxing to Gearldine Bienenstock at [phone]

## 2023-03-05 ENCOUNTER — Other Ambulatory Visit: Payer: Self-pay

## 2023-03-05 ENCOUNTER — Encounter: Payer: Self-pay | Admitting: Adult Health

## 2023-03-05 ENCOUNTER — Ambulatory Visit (INDEPENDENT_AMBULATORY_CARE_PROVIDER_SITE_OTHER): Payer: Self-pay | Admitting: Adult Health

## 2023-03-05 VITALS — BP 108/79 | HR 74 | Temp 96.7°F

## 2023-03-05 DIAGNOSIS — R0989 Other specified symptoms and signs involving the circulatory and respiratory systems: Secondary | ICD-10-CM

## 2023-03-05 DIAGNOSIS — R6889 Other general symptoms and signs: Secondary | ICD-10-CM

## 2023-03-05 DIAGNOSIS — U071 COVID-19: Secondary | ICD-10-CM

## 2023-03-05 LAB — POC SOFIA 2 FLU + SARS ANTIGEN FIA
Influenza A, POC: NEGATIVE
Influenza B, POC: NEGATIVE
SARS Coronavirus 2 Ag: POSITIVE — AB

## 2023-03-05 NOTE — Progress Notes (Signed)
[facility] [address] [place], [place] [address]   Office Visit Note  Patient Name: John York Date of Birth [id number]  Medical Record number [id number]  Date of Service: 03/05/2023  Chief Complaint  Patient presents with   Acute Visit    Sx started Monday, states his nose has been running, concerned for covid, would like to get tested      HPI Pt is here for a sick visit. Patient reports 5 days ago he started having scratchy throat, and congestion.  He also reports a little cough and some chills.     Current Medication:  Outpatient Encounter Medications as of 03/05/2023  Medication Sig   Blood Glucose Monitoring Suppl (CONTOUR NEXT ONE) KIT 1 each by Does not apply route daily. Please check blood sugar once daily.  May check 1 additional time per day as needed.   cholecalciferol (VITAMIN D) 1000 units tablet Take 1,000 Units daily by mouth.   fexofenadine (ALLEGRA ALLERGY) 180 MG tablet Take 1 tablet (180 mg total) by mouth daily.   fluticasone (FLONASE) 50 MCG/ACT nasal spray Place 2 sprays into both nostrils daily.   glucose blood test strip Use as instructed   metFORMIN (GLUCOPHAGE) 1000 MG tablet Take 1 tablet (1,000 mg total) by mouth daily with breakfast.   rosuvastatin (CRESTOR) 10 MG tablet Take 1 tablet (10 mg total) by mouth daily.   Semaglutide (RYBELSUS) 14 MG TABS Take 1 tablet (14 mg total) by mouth daily.   Ubrogepant (UBRELVY) 100 MG TABS Take 1 tablet (100 mg total) by mouth daily as needed. (Patient not taking: Reported on 03/01/2023)   No facility-administered encounter medications on file as of 03/05/2023.      Medical History: Past Medical History:  Diagnosis Date   Diabetes mellitus without complication (HCC)    Family history of malignant neoplasm of gastrointestinal tract    Personal history of colonic polyps 2013   Screening for obesity    Sleep apnea    Special screening for malignant neoplasms, colon 2013     Vital  Signs: BP 108/79   Pulse 74   Temp (!) 96.7 F (35.9 C) (Tympanic)   SpO2 97%    Review of Systems  Constitutional:  Negative for chills, fatigue and fever.  HENT:  Positive for sore throat. Negative for congestion.   Eyes:  Negative for pain and itching.  Respiratory:  Positive for cough.   Cardiovascular:  Negative for chest pain.  Gastrointestinal:  Negative for diarrhea, nausea and vomiting.  Musculoskeletal:  Negative for myalgias.    Physical Exam Constitutional:      Appearance: Normal appearance.  HENT:     Head: Normocephalic.     Right Ear: Tympanic membrane normal.     Left Ear: Tympanic membrane normal.     Nose: Nose normal.     Mouth/Throat:     Mouth: Mucous membranes are moist.     Pharynx: No oropharyngeal exudate or posterior oropharyngeal erythema.  Eyes:     General:        Right eye: No discharge.        Left eye: No discharge.     Extraocular Movements: Extraocular movements intact.     Pupils: Pupils are equal, round, and reactive to light.  Cardiovascular:     Rate and Rhythm: Normal rate and regular rhythm.     Pulses: Normal pulses.     Heart sounds: Normal heart sounds. No murmur heard. Pulmonary:  Effort: Pulmonary effort is normal. No respiratory distress.     Breath sounds: Normal breath sounds. No wheezing or rhonchi.  Abdominal:     General: Abdomen is flat. Bowel sounds are normal. There is no distension.     Palpations: There is no mass.     Tenderness: There is no abdominal tenderness. There is no guarding.     Hernia: No hernia is present.  Musculoskeletal:        General: No swelling or deformity. Normal range of motion.     Cervical back: Normal range of motion.  Skin:    General: Skin is warm and dry.     Capillary Refill: Capillary refill takes less than 2 seconds.  Neurological:     General: No focal deficit present.     Mental Status: He is alert.     Cranial Nerves: No cranial nerve deficit.     Gait: Gait normal.   Psychiatric:        Mood and Affect: Mood normal.        Behavior: Behavior normal.        Judgment: Judgment normal.    Results for orders placed or performed in visit on 03/05/23 (from the past 24 hours)  POC SOFIA 2 FLU + SARS ANTIGEN FIA     Status: Abnormal   Collection Time: 03/05/23  9:25 AM  Result Value Ref Range   Influenza A, POC Negative Negative   Influenza B, POC Negative Negative   SARS Coronavirus 2 Ag Positive (A) Negative    Assessment/Plan: 1. COVID-19 (Primary) Rest, and drink plenty of water.  Use cough drops, gargle warm sal water or drink wam liquids (like tea with honey) as needed for cough/throat irritation.   Take over-the-counter medicines (such as Dayquil or Nyquil) as discussed at your visit to help manage your symptoms.  Send a MyChart message to the provider or schedule a return appointment as needed for new/worsening symptoms (especially shortness of breath or chest pain) or if symptoms not improving with recommended treatment over the next 5-7 days.      2. Runny nose  3. Flu-like symptoms     General Counseling: rilee feland understanding of the findings of todays visit and agrees with plan of treatment. I have discussed any further diagnostic evaluation that may be needed or ordered today. We also reviewed his medications today. he has been encouraged to call the office with any questions or concerns that should arise related to todays visit.   Orders Placed This Encounter  Procedures   POC SOFIA 2 FLU + SARS ANTIGEN FIA    No orders of the defined types were placed in this encounter.   Time spent:15 Minutes    Johnna Acosta AGNP-C Nurse Practitioner

## 2023-03-16 DIAGNOSIS — G4733 Obstructive sleep apnea (adult) (pediatric): Secondary | ICD-10-CM | POA: Diagnosis not present

## 2023-03-24 DIAGNOSIS — E119 Type 2 diabetes mellitus without complications: Secondary | ICD-10-CM | POA: Diagnosis not present

## 2023-03-24 DIAGNOSIS — E782 Mixed hyperlipidemia: Secondary | ICD-10-CM | POA: Diagnosis not present

## 2023-03-24 DIAGNOSIS — R9431 Abnormal electrocardiogram [ECG] [EKG]: Secondary | ICD-10-CM | POA: Diagnosis not present

## 2023-03-24 DIAGNOSIS — E669 Obesity, unspecified: Secondary | ICD-10-CM | POA: Diagnosis not present

## 2023-03-24 NOTE — Telephone Encounter (Signed)
 Faxing it now.

## 2023-03-24 NOTE — Telephone Encounter (Signed)
 Copied from CRM [id number]. Topic: General - Other >> Mar 24, 2023  3:04 PM Turkey B wrote: Reason for CRM: tara from [facility] clinic cardiology, says needs abnormal EKG faxed over to further assist pt. Fx is [phone]

## 2023-04-06 DIAGNOSIS — I252 Old myocardial infarction: Secondary | ICD-10-CM | POA: Diagnosis not present

## 2023-04-06 DIAGNOSIS — R9431 Abnormal electrocardiogram [ECG] [EKG]: Secondary | ICD-10-CM | POA: Diagnosis not present

## 2023-04-21 ENCOUNTER — Other Ambulatory Visit: Payer: Self-pay

## 2023-04-21 ENCOUNTER — Ambulatory Visit (INDEPENDENT_AMBULATORY_CARE_PROVIDER_SITE_OTHER): Payer: Self-pay | Admitting: Adult Health

## 2023-04-21 ENCOUNTER — Encounter: Payer: Self-pay | Admitting: Adult Health

## 2023-04-21 VITALS — BP 130/82 | HR 82 | Temp 96.7°F | Ht 69.0 in | Wt 265.0 lb

## 2023-04-21 DIAGNOSIS — J3489 Other specified disorders of nose and nasal sinuses: Secondary | ICD-10-CM

## 2023-04-21 DIAGNOSIS — J029 Acute pharyngitis, unspecified: Secondary | ICD-10-CM

## 2023-04-21 LAB — POC COVID19 BINAXNOW: SARS Coronavirus 2 Ag: NEGATIVE

## 2023-04-21 NOTE — Progress Notes (Signed)
 Therapist, [facility] [address] [place], [place] [address]   Office Visit Note  Patient Name: John York Date of Birth [id number]  Medical Record number [id number]  Date of Service: 04/21/2023  Chief Complaint  Patient presents with   Acute Visit    Patient reports sneezing and nasal congestion x 3-4 days. Denies fever. He takes fexofenadine  and uses Flonase  daily.     HPI Pt is here for a sick visit. Patient reports 3 days ago he started having runny nose, headache, and sneezing. Denies ear pain, sore throat, cough, fever or chills.  He had covid in December 2024, and this feels similar.    Current Medication:  Outpatient Encounter Medications as of 04/21/2023  Medication Sig   Blood Glucose Monitoring Suppl (CONTOUR NEXT ONE) KIT 1 each by Does not apply route daily. Please check blood sugar once daily.  May check 1 additional time per day as needed.   cholecalciferol (VITAMIN D ) 1000 units tablet Take 1,000 Units daily by mouth.   fexofenadine  (ALLEGRA  ALLERGY) 180 MG tablet Take 1 tablet (180 mg total) by mouth daily.   fluticasone  (FLONASE ) 50 MCG/ACT nasal spray Place 2 sprays into both nostrils daily.   glucose blood test strip Use as instructed   metFORMIN  (GLUCOPHAGE ) 1000 MG tablet Take 1 tablet (1,000 mg total) by mouth daily with breakfast.   rosuvastatin  (CRESTOR ) 10 MG tablet Take 1 tablet (10 mg total) by mouth daily.   Semaglutide  (RYBELSUS ) 14 MG TABS Take 1 tablet (14 mg total) by mouth daily.   Ubrogepant  (UBRELVY ) 100 MG TABS Take 1 tablet (100 mg total) by mouth daily as needed. (Patient not taking: Reported on 04/21/2023)   No facility-administered encounter medications on file as of 04/21/2023.      Medical History: Past Medical History:  Diagnosis Date   Diabetes mellitus without complication (HCC)    Family history of malignant neoplasm of gastrointestinal tract    Personal history of colonic polyps 2013   Screening for obesity    Sleep apnea     Special screening for malignant neoplasms, colon 2013     Vital Signs: BP 130/82   Pulse 82   Temp (!) 96.7 F (35.9 C)   Ht 5' 9 (1.753 m)   Wt 265 lb (120.2 kg)   SpO2 97%   BMI 39.13 kg/m    Review of Systems  Constitutional:  Negative for chills, fatigue and fever.  HENT:  Positive for congestion, sinus pressure and sneezing.   Eyes:  Negative for pain and itching.  Neurological:  Positive for headaches.    Physical Exam Constitutional:      Appearance: Normal appearance.  HENT:     Head: Normocephalic.     Right Ear: Tympanic membrane normal.     Left Ear: Tympanic membrane normal.     Nose: Nose normal.     Mouth/Throat:     Mouth: Mucous membranes are moist.     Pharynx: No oropharyngeal exudate or posterior oropharyngeal erythema.  Eyes:     General:        Right eye: No discharge.        Left eye: No discharge.     Extraocular Movements: Extraocular movements intact.     Pupils: Pupils are equal, round, and reactive to light.  Cardiovascular:     Rate and Rhythm: Normal rate and regular rhythm.     Pulses: Normal pulses.     Heart sounds: Normal heart sounds.  No murmur heard. Pulmonary:     Effort: Pulmonary effort is normal. No respiratory distress.     Breath sounds: Normal breath sounds. No wheezing or rhonchi.  Abdominal:     General: Abdomen is flat. Bowel sounds are normal. There is no distension.     Palpations: There is no mass.     Tenderness: There is no abdominal tenderness. There is no guarding.     Hernia: No hernia is present.  Musculoskeletal:        General: No swelling or deformity. Normal range of motion.     Cervical back: Normal range of motion.  Skin:    General: Skin is warm and dry.     Capillary Refill: Capillary refill takes less than 2 seconds.  Neurological:     General: No focal deficit present.     Mental Status: He is alert.     Cranial Nerves: No cranial nerve deficit.     Gait: Gait normal.  Psychiatric:         Mood and Affect: Mood normal.        Behavior: Behavior normal.        Judgment: Judgment normal.    Results for orders placed or performed in visit on 04/21/23 (from the past 24 hours)  POC COVID-19     Status: Normal   Collection Time: 04/21/23  8:52 AM  Result Value Ref Range   SARS Coronavirus 2 Ag Negative Negative    Assessment/Plan: 1. Sinus drainage (Primary) OTc meds as discussed. Follow up via MyChart messenger if symptoms fail to improve or may return to clinic as needed for worsening symptoms.    2. Sore throat - POC COVID-19     General Counseling: John York understanding of the findings of todays visit and agrees with plan of treatment. I have discussed any further diagnostic evaluation that may be needed or ordered today. We also reviewed his medications today. he has been encouraged to call the office with any questions or concerns that should arise related to todays visit.   Orders Placed This Encounter  Procedures   POC COVID-19    No orders of the defined types were placed in this encounter.   Time spent:15 Minutes    John York AGNP-C Nurse Practitioner

## 2023-04-29 DIAGNOSIS — E669 Obesity, unspecified: Secondary | ICD-10-CM | POA: Diagnosis not present

## 2023-04-29 DIAGNOSIS — E782 Mixed hyperlipidemia: Secondary | ICD-10-CM | POA: Diagnosis not present

## 2023-04-29 DIAGNOSIS — R9431 Abnormal electrocardiogram [ECG] [EKG]: Secondary | ICD-10-CM | POA: Diagnosis not present

## 2023-04-29 DIAGNOSIS — E119 Type 2 diabetes mellitus without complications: Secondary | ICD-10-CM | POA: Diagnosis not present

## 2023-04-30 DIAGNOSIS — G4733 Obstructive sleep apnea (adult) (pediatric): Secondary | ICD-10-CM | POA: Diagnosis not present

## 2023-05-28 DIAGNOSIS — G4733 Obstructive sleep apnea (adult) (pediatric): Secondary | ICD-10-CM | POA: Diagnosis not present

## 2023-06-15 ENCOUNTER — Other Ambulatory Visit: Payer: Self-pay

## 2023-06-15 ENCOUNTER — Ambulatory Visit (INDEPENDENT_AMBULATORY_CARE_PROVIDER_SITE_OTHER): Payer: Self-pay | Admitting: Oncology

## 2023-06-15 ENCOUNTER — Encounter: Payer: Self-pay | Admitting: Oncology

## 2023-06-15 VITALS — BP 133/76 | HR 72 | Temp 95.2°F | Ht 69.0 in | Wt 265.0 lb

## 2023-06-15 DIAGNOSIS — H00024 Hordeolum internum left upper eyelid: Secondary | ICD-10-CM

## 2023-06-15 MED ORDER — GENTAMICIN SULFATE 0.3 % OP SOLN: 1.0000 [drp] | Freq: Two times a day (BID) | OPHTHALMIC | 0 refills | Status: DC

## 2023-06-15 MED ORDER — ERYTHROMYCIN 5 MG/GM OP OINT: 1.0000 | TOPICAL_OINTMENT | Freq: Every day | OPHTHALMIC | 0 refills | Status: DC

## 2023-06-15 NOTE — Progress Notes (Signed)
 [facility]  [address] [place], [place] [address] Phone: [phone] Fax: [phone]   Office Visit Note  Patient Name: John York  Date of NUUVO:[id number]  Med Rec number [id number]  Date of Service: 06/15/2023  Patient has no known allergies.  Chief Complaint  Patient presents with   Stye    Stye on upper lid of L eye; symptoms began 3 days ago and he has been applying warm compresses since then but symptoms have not improved.   HPI Patient is an 63 y.o. male who presents to clinic for swelling of his upper eye lid of left eye that he noticed on Sunday. Has been applying warm compresses without improvement. Reports some discomfort when he closes his eye. Noticed some crusting and oozing this morning to left corner when he woke up.  Reports he has had styes in the past last 1 was about 2 years ago.  Feels like there might be something in his right eye.  Has not tried any eyedrops.  Denies any other symptoms.  Current Medication:  Outpatient Encounter Medications as of 06/15/2023  Medication Sig   Blood Glucose Monitoring Suppl (CONTOUR NEXT ONE) KIT 1 each by Does not apply route daily. Please check blood sugar once daily.  May check 1 additional time per day as needed.   cholecalciferol (VITAMIN D) 1000 units tablet Take 1,000 Units daily by mouth.   erythromycin ophthalmic ointment Place 1 Application into both eyes at bedtime.   fexofenadine (ALLEGRA ALLERGY) 180 MG tablet Take 1 tablet (180 mg total) by mouth daily.   fluticasone (FLONASE) 50 MCG/ACT nasal spray Place 2 sprays into both nostrils daily.   gentamicin (GARAMYCIN) 0.3 % ophthalmic solution Place 1 drop into both eyes 2 (two) times daily.   glucose blood test strip Use as instructed   metFORMIN (GLUCOPHAGE) 1000 MG tablet Take 1 tablet (1,000 mg total) by mouth daily with breakfast.   rosuvastatin (CRESTOR) 10 MG tablet Take 1 tablet (10 mg total) by mouth daily.   Semaglutide (RYBELSUS) 14  MG TABS Take 1 tablet (14 mg total) by mouth daily.   Ubrogepant (UBRELVY) 100 MG TABS Take 1 tablet (100 mg total) by mouth daily as needed. (Patient not taking: Reported on 03/01/2023)   No facility-administered encounter medications on file as of 06/15/2023.      Medical History: Past Medical History:  Diagnosis Date   Diabetes mellitus without complication (HCC)    Family history of malignant neoplasm of gastrointestinal tract    Personal history of colonic polyps 2013   Screening for obesity    Sleep apnea    Special screening for malignant neoplasms, colon 2013     Vital Signs: BP 133/76   Pulse 72   Temp (!) 95.2 F (35.1 C)   Ht 5\' 9"  (1.753 m)   Wt 265 lb (120.2 kg)   SpO2 98%   BMI 39.13 kg/m   ROS: As per HPI.  All other pertinent ROS negative.     Review of Systems  Eyes:  Positive for pain and discharge.    Physical Exam Constitutional:      Appearance: Normal appearance. He is obese.  Eyes:     General:        Left eye: Discharge and hordeolum present.    Conjunctiva/sclera:     Left eye: Left conjunctiva is not injected. Chemosis present. No exudate.    Comments: Swelling to the left upper and conjunctival.  No  fluctuant head.   Neurological:     Mental Status: He is alert.     No results found for this or any previous visit (from the past 24 hours).  Assessment/Plan: 1. Hordeolum internum of left upper eyelid (Primary) Exam is consistent with left hordeolum internum of his upper eyelid with quite a bit of swelling.  We discussed erythromycin ointment to the lash line and gentamicin drops twice daily to both eyes.  Discussed using warm compresses as needed to facilitate opening her eyes in the morning.  May use saline drops to help keep eyes lubricated.  Practice good hand hygiene and do not touch her eyes or face.  Throw away make up that has been in contact with your eyes and wash all linen exposed to your face.   - gentamicin (GARAMYCIN) 0.3 %  ophthalmic solution; Place 1 drop into both eyes 2 (two) times daily.  Dispense: 5 mL; Refill: 0 - erythromycin ophthalmic ointment; Place 1 Application into both eyes at bedtime.  Dispense: 3.5 g; Refill: 0   General Counseling: harim bi understanding of the findings of todays visit and agrees with plan of treatment. I have discussed any further diagnostic evaluation that may be needed or ordered today. We also reviewed his medications today. he has been encouraged to call the office with any questions or concerns that should arise related to todays visit.   No orders of the defined types were placed in this encounter.   Meds ordered this encounter  Medications   gentamicin (GARAMYCIN) 0.3 % ophthalmic solution    Sig: Place 1 drop into both eyes 2 (two) times daily.    Dispense:  5 mL    Refill:  0   erythromycin ophthalmic ointment    Sig: Place 1 Application into both eyes at bedtime.    Dispense:  3.5 g    Refill:  0    I spent 18 minutes dedicated to the care of this patient (face-to-face and non-face-to-face) on the date of the encounter to include what is described in the assessment and plan.   Durenda Hurt, NP 06/15/2023 8:48 AM

## 2023-07-31 ENCOUNTER — Other Ambulatory Visit: Payer: Self-pay | Admitting: Family Medicine

## 2023-08-02 ENCOUNTER — Telehealth: Payer: Self-pay | Admitting: Family Medicine

## 2023-08-02 NOTE — Telephone Encounter (Signed)
 Copied from CRM [id number]. Topic: Referral - Status >> Aug 02, 2023  3:56 PM Felizardo Hotter wrote: Reason for CRM: Pt called stated that referral# [id number] has expired and needs a new referral to Dr. Mason Sole, [facility].

## 2023-08-03 ENCOUNTER — Encounter: Payer: Self-pay | Admitting: Family Medicine

## 2023-08-03 NOTE — Telephone Encounter (Signed)
 Pt notified will contact office again

## 2023-08-11 DIAGNOSIS — G4733 Obstructive sleep apnea (adult) (pediatric): Secondary | ICD-10-CM | POA: Diagnosis not present

## 2023-08-24 DIAGNOSIS — Z1331 Encounter for screening for depression: Secondary | ICD-10-CM | POA: Diagnosis not present

## 2023-08-24 DIAGNOSIS — R6889 Other general symptoms and signs: Secondary | ICD-10-CM | POA: Diagnosis not present

## 2023-08-24 DIAGNOSIS — G253 Myoclonus: Secondary | ICD-10-CM | POA: Diagnosis not present

## 2023-08-24 DIAGNOSIS — F0781 Postconcussional syndrome: Secondary | ICD-10-CM | POA: Diagnosis not present

## 2023-08-24 DIAGNOSIS — H9313 Tinnitus, bilateral: Secondary | ICD-10-CM | POA: Diagnosis not present

## 2023-08-30 ENCOUNTER — Encounter: Payer: Self-pay | Admitting: Family Medicine

## 2023-08-30 ENCOUNTER — Ambulatory Visit: Payer: Self-pay | Admitting: Family Medicine

## 2023-08-30 VITALS — BP 124/70 | HR 83 | Resp 16 | Ht 69.0 in | Wt 258.1 lb

## 2023-08-30 DIAGNOSIS — F0781 Postconcussional syndrome: Secondary | ICD-10-CM

## 2023-08-30 DIAGNOSIS — I739 Peripheral vascular disease, unspecified: Secondary | ICD-10-CM

## 2023-08-30 DIAGNOSIS — R42 Dizziness and giddiness: Secondary | ICD-10-CM

## 2023-08-30 DIAGNOSIS — E785 Hyperlipidemia, unspecified: Secondary | ICD-10-CM

## 2023-08-30 DIAGNOSIS — R6889 Other general symptoms and signs: Secondary | ICD-10-CM

## 2023-08-30 DIAGNOSIS — E1169 Type 2 diabetes mellitus with other specified complication: Secondary | ICD-10-CM | POA: Diagnosis not present

## 2023-08-30 DIAGNOSIS — G4733 Obstructive sleep apnea (adult) (pediatric): Secondary | ICD-10-CM

## 2023-08-30 DIAGNOSIS — G43109 Migraine with aura, not intractable, without status migrainosus: Secondary | ICD-10-CM | POA: Diagnosis not present

## 2023-08-30 DIAGNOSIS — Z7984 Long term (current) use of oral hypoglycemic drugs: Secondary | ICD-10-CM

## 2023-08-30 DIAGNOSIS — J302 Other seasonal allergic rhinitis: Secondary | ICD-10-CM

## 2023-08-30 LAB — POCT GLYCOSYLATED HEMOGLOBIN (HGB A1C): Hemoglobin A1C: 5.7 % — AB (ref 4.0–5.6)

## 2023-08-30 MED ORDER — RYBELSUS 14 MG PO TABS: 14.0000 mg | ORAL_TABLET | Freq: Every day | ORAL | 1 refills | Status: DC

## 2023-08-30 MED ORDER — ROSUVASTATIN CALCIUM 10 MG PO TABS: 10.0000 mg | ORAL_TABLET | Freq: Every day | ORAL | 1 refills | Status: DC

## 2023-08-30 MED ORDER — METFORMIN HCL 1000 MG PO TABS: 1000.0000 mg | ORAL_TABLET | Freq: Every day | ORAL | 1 refills | Status: DC

## 2023-08-30 MED ORDER — METOCLOPRAMIDE HCL 5 MG PO TABS: 5.0000 mg | ORAL_TABLET | Freq: Two times a day (BID) | ORAL | 0 refills | Status: DC | PRN

## 2023-08-30 NOTE — Progress Notes (Signed)
 Name: John York   MRN: [id number]    DOB: 02-17-1961   Date:08/30/2023       Progress Note  Subjective  Chief Complaint  Chief Complaint  Patient presents with   Medical Management of Chronic Issues   Discussed the use of AI scribe software for clinical note transcription with the patient, who gave verbal consent to proceed.  History of Present Illness John York is a 63 year old male with post concussion syndrome and migraines who presents for a regular follow-up.  He has a history of post concussion syndrome and migraines. Recently, he experienced a migraine after a six-month period without one, although it was not severe. He did not take Nurtec as it was not accessible at the time. He experiences aura with migraines and usually carries medication for immediate use. An MRI showed mild white matter disease in the frontal lobes and small vascular ischemia.  He has obstructive sleep apnea and uses a CPAP machine every night. Despite this, he continues to experience vivid dreams and jerking movements. He has lost weight recently, currently weighing 258 pounds, down from 269 pounds six months ago.  He is taking Rybelsus  14 mg and metformin  1000 mg daily for diabetes. His A1c is well-controlled at 5.7%. He experiences constipation, which he attributes to decreased food intake, and occasional nausea. He is working on improving his diet and increasing physical activity.  He has dyslipidemia and small vessel disease of the brain, for which he takes rosuvastatin . No issues with this medication. His small vessel disease is being monitored by the neurologist.    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 Lorel Roes   COLONOSCOPY W/ POLYPECTOMY  2008   7mm size sessile non bleeding polyp was found in the sigmoid colon   COLONOSCOPY WITH PROPOFOL  N/A 02/24/2017   Procedure: COLONOSCOPY WITH PROPOFOL ;  Surgeon: Jerlean Mood, MD;  Location: [facility] ENDOSCOPY;  Service: Endoscopy;  Laterality: N/A;   COLONOSCOPY WITH PROPOFOL  N/A 03/26/2022   Procedure: COLONOSCOPY WITH PROPOFOL ;  Surgeon: Selena Daily, MD;  Location: [facility] ENDOSCOPY;  Service: Gastroenterology;  Laterality: N/A;    Family History  Problem Relation Age of Onset   Cancer Sister        breast   Colon cancer Brother    Diabetes Father    Diabetes Mother    Hypertension Mother     Social History   Tobacco Use   Smoking status: Never   Smokeless tobacco: Never  Substance Use Topics   Alcohol use: No    Alcohol/week: 0.0 standard drinks of alcohol     Current Outpatient Medications:    Blood Glucose Monitoring Suppl (CONTOUR NEXT ONE) KIT, 1 each by Does not apply route daily. Please check blood sugar once daily.  May check 1 additional time per  day as needed., Disp: 1 kit, Rfl: 0   cholecalciferol (VITAMIN D ) 1000 units tablet, Take 1,000 Units daily by mouth., Disp: , Rfl:    fexofenadine  (ALLEGRA  ALLERGY) 180 MG tablet, Take 1 tablet (180 mg total) by mouth daily., Disp: 90 tablet, Rfl: 1   fluticasone  (FLONASE ) 50 MCG/ACT nasal spray, Place 2 sprays into both nostrils daily., Disp: 16 g, Rfl: 2   glucose blood test strip, Use as instructed, Disp: 100 each, Rfl: 12   Melatonin 5 MG CHEW, Chew by mouth., Disp: ,  Rfl:    metoCLOPramide (REGLAN) 5 MG tablet, Take 1 tablet (5 mg total) by mouth 2 (two) times daily as needed for nausea., Disp: 60 tablet, Rfl: 0   metFORMIN  (GLUCOPHAGE ) 1000 MG tablet, Take 1 tablet (1,000 mg total) by mouth daily with breakfast., Disp: 90 tablet, Rfl: 1   rosuvastatin  (CRESTOR ) 10 MG tablet, Take 1 tablet (10 mg total) by mouth daily., Disp: 90 tablet, Rfl: 1   Semaglutide  (RYBELSUS ) 14 MG TABS, Take 1 tablet (14 mg total) by mouth daily., Disp: 90 tablet, Rfl: 1   Ubrogepant  (UBRELVY ) 100 MG TABS, Take 1 tablet (100 mg total) by mouth daily as needed. (Patient not taking: Reported on 08/30/2023), Disp: 30 tablet, Rfl: 0  No Known Allergies  I personally reviewed active problem list, medication list, allergies with the patient/caregiver today.   ROS  Ten systems reviewed and is negative except as mentioned in HPI    Objective Physical Exam MEASUREMENTS: Weight- 258, BMI- 38.0. CONSTITUTIONAL: Patient appears well-developed and well-nourished.  No distress. HEENT: Head atraumatic, normocephalic, neck supple. CARDIOVASCULAR: Normal rate, regular rhythm and normal heart sounds.  No murmur heard. No BLE edema. PULMONARY: Effort normal and breath sounds normal. No respiratory distress. ABDOMINAL: There is no tenderness or distention. MUSCULOSKELETAL: Normal gait. Without gross motor or sensory deficit. PSYCHIATRIC: Patient has a normal mood and affect. behavior is normal. Judgment and thought content normal.   Vitals:   08/30/23 1507  BP: 124/70  Pulse: 83  Resp: 16  SpO2: 98%  Weight: 258 lb 1.6 oz (117.1 kg)  Height: 5' 9 (1.753 m)    Body mass index is 38.11 kg/m.  Recent Results (from the past 2160 hours)  POCT glycosylated hemoglobin (Hb A1C)     Status: Abnormal   Collection Time: 08/30/23  3:15 PM  Result Value Ref Range   Hemoglobin A1C 5.7 (A) 4.0 - 5.6 %   HbA1c POC (<> result, manual entry)     HbA1c, POC (prediabetic range)     HbA1c, POC  (controlled diabetic range)      Diabetic Foot Exam:     PHQ2/9:    08/30/2023    3:01 PM 03/01/2023    2:34 PM 01/01/2023    8:03 AM 01/01/2023    7:58 AM 10/05/2022    3:08 PM  Depression screen PHQ 2/9  Decreased Interest 0 0 0 0 0  Down, Depressed, Hopeless 0 0 0 0 0  PHQ - 2 Score 0 0 0 0 0  Altered sleeping  0 1  0  Tired, decreased energy  0 1  0  Change in appetite  0 0  0  Feeling bad or failure about yourself   0 0  0  Trouble concentrating  0 1  0  Moving slowly or fidgety/restless  0 0  0  Suicidal thoughts  0 0  0  PHQ-9 Score  0 3  0  Difficult doing  work/chores  Not difficult at all Not difficult at all  Not difficult at all    phq 9 is negative  Fall Risk:    08/30/2023    3:01 PM 03/01/2023    2:33 PM 01/01/2023    8:03 AM 10/05/2022    3:08 PM 07/06/2022    1:42 PM  Fall Risk   Falls in the past year? 0 1 0 0 0  Number falls in past yr: 0 0  0   Injury with Fall? 0 0  0   Risk for fall due to : No Fall Risks Other (Comment) No Fall Risks No Fall Risks No Fall Risks  Risk for fall due to: Comment  while sleeping     Follow up Falls prevention discussed;Education provided;Falls evaluation completed Falls evaluation completed;Education provided;Falls prevention discussed Falls prevention discussed;Education provided;Falls evaluation completed Falls prevention discussed;Education provided;Falls evaluation completed Falls prevention discussed     Assessment & Plan Morbid obesity due to BMI over 35 with co-morbidities such as DM and OSA BMI 38, 11-pound weight loss with Rybelsus  and diet changes. Further weight loss encouraged to reduce BMI below 35. - Continue Rybelsus  14 mg daily. - Encourage dietary changes and increased physical activity. - Monitor BMI and adjust treatment as needed.  Type 2 diabetes mellitus Well-controlled with A1c 5.7. No significant side effects from Rybelsus  and metformin , except constipation likely due to decreased food  intake. - Continue Rybelsus  14 mg daily. - Continue metformin  1000 mg daily. - Prescribe metoclopramide 5 mg as needed for constipation. - Encourage dietary fiber intake: spinach, lettuce, oranges, apples. - Encourage regular physical activity.  Obstructive sleep apnea Managed with nightly CPAP. Vivid dreams persist. Referral to sleep medicine for further evaluation. Weight loss may require CPAP pressure adjustment. - Continue CPAP use nightly. - Follow up with sleep medicine. - Consider melatonin for sleep, avoid gummies. - Consider magnesium glycinate for sleep.  Dyslipidemia Managed with rosuvastatin . No side effects reported. - Continue rosuvastatin  as prescribed. - Maintain 90-day medication supply.  Small vessel disease of the brain Mild white matter disease in frontal lobes on MRI. - Continue current management with rosuvastatin .  Post-concussion syndrome Persistent vertigo improved since last visit. Recent mild migraine. Metoclopramide may assist with vertigo. - Consider metoclopramide for vertigo as needed. - Ensure availability of Nurtec for migraine management.  Migraine Recent mild migraine. Nurtec was inaccessible during episode. Emphasized ensuring immediate access to medication. - Ensure Nurtec is readily accessible during migraine episodes. - Consider carrying Nurtec in a small container for easy access.

## 2023-09-11 DIAGNOSIS — G4733 Obstructive sleep apnea (adult) (pediatric): Secondary | ICD-10-CM | POA: Diagnosis not present

## 2023-09-27 ENCOUNTER — Other Ambulatory Visit: Payer: Self-pay | Admitting: Family Medicine

## 2023-09-27 DIAGNOSIS — F0781 Postconcussional syndrome: Secondary | ICD-10-CM

## 2023-09-27 DIAGNOSIS — E1169 Type 2 diabetes mellitus with other specified complication: Secondary | ICD-10-CM

## 2023-10-11 DIAGNOSIS — G4733 Obstructive sleep apnea (adult) (pediatric): Secondary | ICD-10-CM | POA: Diagnosis not present

## 2023-10-24 ENCOUNTER — Other Ambulatory Visit: Payer: Self-pay | Admitting: Family Medicine

## 2023-10-24 DIAGNOSIS — J302 Other seasonal allergic rhinitis: Secondary | ICD-10-CM

## 2023-11-10 DIAGNOSIS — G4733 Obstructive sleep apnea (adult) (pediatric): Secondary | ICD-10-CM | POA: Diagnosis not present

## 2023-11-11 DIAGNOSIS — G4733 Obstructive sleep apnea (adult) (pediatric): Secondary | ICD-10-CM | POA: Diagnosis not present

## 2023-12-06 DIAGNOSIS — R6889 Other general symptoms and signs: Secondary | ICD-10-CM | POA: Diagnosis not present

## 2023-12-06 DIAGNOSIS — Z8669 Personal history of other diseases of the nervous system and sense organs: Secondary | ICD-10-CM | POA: Diagnosis not present

## 2023-12-06 DIAGNOSIS — F0781 Postconcussional syndrome: Secondary | ICD-10-CM | POA: Diagnosis not present

## 2023-12-06 DIAGNOSIS — G4733 Obstructive sleep apnea (adult) (pediatric): Secondary | ICD-10-CM | POA: Diagnosis not present

## 2024-01-07 ENCOUNTER — Encounter: Payer: Self-pay | Admitting: Family Medicine

## 2024-01-10 DIAGNOSIS — G4733 Obstructive sleep apnea (adult) (pediatric): Secondary | ICD-10-CM | POA: Diagnosis not present

## 2024-01-14 ENCOUNTER — Encounter: Payer: Self-pay | Admitting: Family Medicine

## 2024-01-14 ENCOUNTER — Ambulatory Visit
Admission: RE | Admit: 2024-01-14 | Discharge: 2024-01-14 | Disposition: A | Discharge status: Home / Self Care | Source: Ambulatory Visit | Attending: Family Medicine | Admitting: Family Medicine

## 2024-01-14 ENCOUNTER — Ambulatory Visit
Admission: RE | Admit: 2024-01-14 | Discharge: 2024-01-14 | Disposition: A | Discharge status: Home / Self Care | Attending: Family Medicine | Admitting: Family Medicine

## 2024-01-14 ENCOUNTER — Ambulatory Visit (INDEPENDENT_AMBULATORY_CARE_PROVIDER_SITE_OTHER): Admitting: Family Medicine

## 2024-01-14 VITALS — BP 118/80 | HR 71 | Resp 16 | Ht 69.0 in | Wt 260.0 lb

## 2024-01-14 DIAGNOSIS — M533 Sacrococcygeal disorders, not elsewhere classified: Secondary | ICD-10-CM | POA: Insufficient documentation

## 2024-01-14 DIAGNOSIS — Z0000 Encounter for general adult medical examination without abnormal findings: Secondary | ICD-10-CM

## 2024-01-14 MED ORDER — CELECOXIB 100 MG PO CAPS: 100.0000 mg | ORAL_CAPSULE | Freq: Two times a day (BID) | ORAL | 0 refills | Status: DC

## 2024-01-14 NOTE — Addendum Note (Signed)
 Addended by: GLENARD MIRE F on: 01/14/2024 03:41 PM   Modules accepted: Level of Service

## 2024-01-14 NOTE — Patient Instructions (Signed)
Tailbone Injury  The tailbone is the small bone at the lower end of the spine. The tailbone is also called the coccyx.A tailbone injury may involve stretched ligaments, bruising, or a broken bone/break (fracture). Tailbone injuries can be painful, and some may take a long time to heal. What are the causes? This condition may be caused by: Falling and landing on the tailbone. Repeated strain or friction from sitting for long periods of time. This may include actions such as rowing or bicycling. Childbirth. In some cases, the cause may not be known. What are the signs or symptoms? Symptoms of this condition include: Pain in the tailbone area or lower back, especially when sitting. Pain or difficulty when standing up from a sitting position. Bruising or swelling in the tailbone area. Painful bowel movements. In females, pain during sex. How is this diagnosed? This condition may be diagnosed based on your symptoms and a physical exam. If your health care provider suspects a fracture, you may have additional tests, such as: X-rays. CT scan. MRI. How is this treated? Most tailbone injuries heal on their own in 4-6 weeks. However, recovery time may be longer if there is a fracture. If treatment is needed, it may include: NSAIDs or other over-the-counter medicines to help relieve your pain. Rubber or inflated ring or cushion to take pressure off the tailbone when sitting. Physical therapy. Injection with local anesthesia and steroid medicine. This is done only if the pain does not improve over time with over-the-counter pain medicines. Follow these instructions at home: Activity Rest as told by your health care provider. Do not sit for a long time without moving. Get up to take short walks every 1-2 hours. This will improve blood flow and breathing. Ask for help if you feel weak or unsteady. Wear appropriate padding and sports gear when bicycling or rowing. This will prevent repeating an  injury that is caused by strain or friction. Do exercises as told by your health care provider. Increase your activity as the pain allows. Return to your normal activities as told by your health care provider. Ask your health care provider what activities are safe for you. Managing pain, stiffness, and swelling To help decrease discomfort when sitting: Sit on your rubber or inflated ring or cushion as told by your health care provider. Lean forward when you sit. If directed, put ice on the injured area. To do this: Put ice in a plastic bag. Place a towel between your skin and the bag. Leave the ice on for 20 minutes, 2-3 times per day for the first 1-2 days. If your skin turns bright red, remove the ice right away to prevent skin damage. The risk of skin damage is higher if you cannot feel pain, heat, or cold. If directed, apply heat to the affected area as often as told by your health care provider. Use the heat source that your health care provider recommends, such as a moist heat pack or a heating pad. Place a towel between your skin and the heat source. Leave the heat on for 20-30 minutes. If your skin turns bright red, remove the heat right away to prevent burns. The risk of burns is higher if you cannot feel pain, heat, or cold. General instructions Take over-the-counter and prescription medicines only as told by your health care provider. Your condition or medicine may cause constipation or painful bowel movements. To prevent or treat constipation, you may need to: Drink enough fluid to keep your urine pale yellow.   Take over-the-counter or prescription medicines. Eat foods that are high in fiber, such as beans, whole grains, and fresh fruits and vegetables. Limit foods that are high in fat and processed sugars, such as fried or sweet foods. Contact a health care provider if: Your pain becomes worse or is not controlled with medicine. Your bowel movements cause a great deal of  discomfort. You are unable to have a bowel movement after 4 days. You have pain during sex. Summary A tailbone injury may involve stretched ligaments, bruising, or a broken bone/break (fracture). Tailbone injuries can be painful. Most heal on their own in 4-6 weeks. Treatment may include taking NSAIDs, doing physical therapy, or using a rubber or inflated ring or cushion when sitting. Follow any recommendations from your health care provider to prevent or treat constipation. This information is not intended to replace advice given to you by your health care provider. Make sure you discuss any questions you have with your health care provider. Document Revised: 06/10/2021 Document Reviewed: 06/10/2021 Elsevier Patient Education  2024 Elsevier Inc.  

## 2024-01-14 NOTE — Progress Notes (Signed)
 Name: John York   MRN: [id number]    DOB: 1960-09-09   Date:01/14/2024       Progress Note  Subjective  Chief Complaint  Chief Complaint  Patient presents with   Annual Exam    HPI  Patient presents for annual CPE and discuss tailbone pain  He has noticed pain on tail bone going on for months, worse when sitting on his couch and driving long distance. It is bothersome and he is getting worried. Denies any fever or chills. No trauma that he can recall of.    IPSS     Row Name 01/14/24 1453         International Prostate Symptom Score   How often have you had the sensation of not emptying your bladder? Not at All     How often have you had to urinate less than every two hours? Not at All     How often have you found you stopped and started again several times when you urinated? Not at All     How often have you found it difficult to postpone urination? Not at All     How often have you had a weak urinary stream? Not at All     How often have you had to strain to start urination? Not at All     How many times did you typically get up at night to urinate? None     Total IPSS Score 0        Diet: he eats at work for lunch - usually salad and the entree from the campus cafeteria, usually home cooked meals at night Exercise: doing daily exercises also stretching daily  Last Dental Exam: up to date  Last Eye Exam: he will schedule it   Depression: phq 9 is negative    01/14/2024    2:44 PM 08/30/2023    3:01 PM 03/01/2023    2:34 PM 01/01/2023    8:03 AM 01/01/2023    7:58 AM  Depression screen PHQ 2/9  Decreased Interest 0 0 0 0 0  Down, Depressed, Hopeless 0 0 0 0 0  PHQ - 2 Score 0 0 0 0 0  Altered sleeping   0 1   Tired, decreased energy   0 1   Change in appetite   0 0   Feeling bad or failure about yourself    0 0   Trouble concentrating   0 1   Moving slowly or fidgety/restless   0 0   Suicidal thoughts   0 0   PHQ-9 Score   0 3   Difficult doing  work/chores   Not difficult at all Not difficult at all     Hypertension:  BP Readings from Last 3 Encounters:  01/14/24 118/80  08/30/23 124/70  06/15/23 133/76    Obesity: Wt Readings from Last 3 Encounters:  01/14/24 260 lb (117.9 kg)  08/30/23 258 lb 1.6 oz (117.1 kg)  06/15/23 265 lb (120.2 kg)   BMI Readings from Last 3 Encounters:  01/14/24 38.40 kg/m  08/30/23 38.11 kg/m  06/15/23 39.13 kg/m     Flowsheet Row Office Visit from 01/14/2024 in [facility]  AUDIT-C Score 1     Single STD testing and prevention (HIV/chl/gon/syphilis):  not applicable Sexual history: not sexually active  Hep C Screening: completed Skin cancer: Discussed monitoring for atypical lesions Colorectal cancer: repeat in 2029 Prostate cancer:  not applicable Lab Results  Component  Value Date   PSA 0.68 04/06/2022   PSA 0.6 02/07/2018     Lung cancer:  Low Dose CT Chest recommended if Age 70-80 years, 30 pack-year currently smoking OR have quit w/in 15years. Patient  is not a candidate for screening   AAA: The USPSTF recommends one-time screening with ultrasonography in men ages 65 to 75 years who have ever smoked. Patient   is not a candidate for screening  ECG:  2024  Vaccines: reviewed with the patient.   Advanced Care Planning: A voluntary discussion about advance care planning including the explanation and discussion of advance directives.  Discussed health care proxy and Living will, and the patient was able to identify a health care proxy as daughter .  Patient does not have a living will and power of attorney of health care   Patient Active Problem List   Diagnosis Date Noted   Polyp of ascending colon 03/26/2022   Polyp of transverse colon 03/26/2022   Dyslipidemia associated with type 2 diabetes mellitus (HCC) 11/15/2020   Constipation 12/16/2018   Seasonal allergic rhinitis 05/30/2018   Hyperlipidemia associated with type 2 diabetes mellitus  (HCC) 02/08/2018   Vertigo 02/07/2018   OSA (obstructive sleep apnea) 02/07/2018   Tinnitus 06/16/2017   Morbid obesity (HCC) 09/25/2014   FHx: migraine headaches 09/25/2014   Calcific tendinitis 03/21/2014   Family history of malignant neoplasm of gastrointestinal tract    History of colonic polyps     Past Surgical History:  Procedure Laterality Date   COLONOSCOPY  02/03/2012   Dr. Dellie   COLONOSCOPY W/ POLYPECTOMY  2008   7mm size sessile non bleeding polyp was found in the sigmoid colon   COLONOSCOPY WITH PROPOFOL  N/A 02/24/2017   Procedure: COLONOSCOPY WITH PROPOFOL ;  Surgeon: Dellie Louanne MATSU, MD;  Location: [facility] ENDOSCOPY;  Service: Endoscopy;  Laterality: N/A;   COLONOSCOPY WITH PROPOFOL  N/A 03/26/2022   Procedure: COLONOSCOPY WITH PROPOFOL ;  Surgeon: Unk Corinn Skiff, MD;  Location: [facility] ENDOSCOPY;  Service: Gastroenterology;  Laterality: N/A;    Family History  Problem Relation Age of Onset   Cancer Sister        breast   Colon cancer Brother    Diabetes Father    Diabetes Mother    Hypertension Mother     Social History   Socioeconomic History   Marital status: Single    Spouse name: Not on file   Number of children: 2   Years of education: Not on file   Highest education level: Not on file  Occupational History    Comment: [employer]  Tobacco Use   Smoking status: Never   Smokeless tobacco: Never  Vaping Use   Vaping status: Never Used  Substance and Sexual Activity   Alcohol use: No    Alcohol/week: 0.0 standard drinks of alcohol   Drug use: No   Sexual activity: Not Currently  Other Topics Concern   Not on file  Social History Narrative   Not on file   Social Drivers of Health   Financial Resource Strain: Low Risk  (01/14/2024)   Overall Financial Resource Strain (CARDIA)    Difficulty of Paying Living Expenses: Not hard at all  Food Insecurity: No Food Insecurity (01/14/2024)   Hunger Vital Sign    Worried About  Running Out of Food in the Last Year: Never true    Ran Out of Food in the Last Year: Never true  Transportation Needs: No Transportation Needs (01/14/2024)  PRAPARE - Administrator, Civil Service (Medical): No    Lack of Transportation (Non-Medical): No  Physical Activity: Insufficiently Active (01/14/2024)   Exercise Vital Sign    Days of Exercise per Week: 1 day    Minutes of Exercise per Session: 30 min  Stress: No Stress Concern Present (01/14/2024)   [employer] of Occupational Health - Occupational Stress Questionnaire    Feeling of Stress: Only a little  Social Connections: Socially Integrated (01/14/2024)   Social Connection and Isolation Panel    Frequency of Communication with Friends and Family: Twice a week    Frequency of Social Gatherings with Friends and Family: Twice a week    Attends Religious Services: More than 4 times per year    Active Member of [employer] or Organizations: Yes    Attends Engineer, Structural: More than 4 times per year    Marital Status: Living with partner  Intimate Partner Violence: Not At Risk (01/14/2024)   Humiliation, Afraid, Rape, and Kick questionnaire    Fear of Current or Ex-Partner: No    Emotionally Abused: No    Physically Abused: No    Sexually Abused: No     Current Outpatient Medications:    Blood Glucose Monitoring Suppl (CONTOUR NEXT ONE) KIT, 1 each by Does not apply route daily. Please check blood sugar once daily.  May check 1 additional time per day as needed., Disp: 1 kit, Rfl: 0   cholecalciferol (VITAMIN D ) 1000 units tablet, Take 1,000 Units daily by mouth., Disp: , Rfl:    fexofenadine  (ALLEGRA ) 180 MG tablet, TAKE 1 TABLET BY MOUTH EVERY DAY, Disp: 90 tablet, Rfl: 0   fluticasone  (FLONASE ) 50 MCG/ACT nasal spray, Place 2 sprays into both nostrils daily., Disp: 16 g, Rfl: 2   glucose blood test strip, Use as instructed, Disp: 100 each, Rfl: 12   Melatonin 5 MG CHEW, Chew by mouth., Disp: ,  Rfl:    metFORMIN  (GLUCOPHAGE ) 1000 MG tablet, Take 1 tablet (1,000 mg total) by mouth daily with breakfast., Disp: 90 tablet, Rfl: 1   metoCLOPramide  (REGLAN ) 5 MG tablet, TAKE 1 TABLET (5 MG TOTAL) BY MOUTH 2 (TWO) TIMES DAILY AS NEEDED FOR NAUSEA., Disp: 60 tablet, Rfl: 0   rosuvastatin  (CRESTOR ) 10 MG tablet, Take 1 tablet (10 mg total) by mouth daily., Disp: 90 tablet, Rfl: 1   Semaglutide  (RYBELSUS ) 14 MG TABS, Take 1 tablet (14 mg total) by mouth daily., Disp: 90 tablet, Rfl: 1   Ubrogepant  (UBRELVY ) 100 MG TABS, Take 1 tablet (100 mg total) by mouth daily as needed. (Patient not taking: Reported on 01/14/2024), Disp: 30 tablet, Rfl: 0  No Known Allergies   ROS  Constitutional: Negative for fever or weight change.  Respiratory: Negative for cough and shortness of breath.   Cardiovascular: Negative for chest pain or palpitations.  Gastrointestinal: Negative for abdominal pain, no bowel changes.  Musculoskeletal: Negative for gait problem or joint swelling.  Skin: Negative for rash.  Neurological: Negative for dizziness or headache.  No other specific complaints in a complete review of systems (except as listed in HPI above).    Objective  Vitals:   01/14/24 1445  BP: 118/80  Pulse: 71  Resp: 16  SpO2: 98%  Weight: 260 lb (117.9 kg)  Height: 5' 9 (1.753 m)    Body mass index is 38.4 kg/m.  Physical Exam  Constitutional: Patient appears well-developed and well-nourished. No distress.  HENT: Head: Normocephalic and atraumatic. Ears:  B TMs ok, no erythema or effusion; Nose: Nose normal. Mouth/Throat: Oropharynx is clear and moist. No oropharyngeal exudate.  Eyes: Conjunctivae and EOM are normal. Pupils are equal, round, and reactive to light. No scleral icterus.  Neck: Normal range of motion. Neck supple. No JVD present. No thyromegaly present.  Cardiovascular: Normal rate, regular rhythm and normal heart sounds.  No murmur heard. No BLE edema. Pulmonary/Chest: Effort  normal and breath sounds normal. No respiratory distress. Abdominal: Soft. Bowel sounds are normal, no distension. There is no tenderness. no masses MALE GENITALIA: Normal descended testes bilaterally, no masses palpated, no hernias, no lesions, no discharge RECTAL: not done  Musculoskeletal: Normal range of motion, no joint effusions.  Palpable coccyx and tender to touch.  Neurological: he is alert and oriented to person, place, and time. No cranial nerve deficit. Coordination, balance, strength, speech and gait are normal.  Skin: Skin is warm and dry. No rash noted. No erythema.  Psychiatric: Patient has a normal mood and affect. behavior is normal. Judgment and thought content normal.     Assessment & Plan  1. Well adult exam (Primary)    Discussed healthier diet, fiber intake, get eye exam done   2. Coccygalgia  X-ray ordered Avoid applying pressure to the area, proper sitting position discussed, may need referral to ortho if no improvement Celebrex 100 mg bid for one week after that prn     -Prostate cancer screening and PSA options (with potential risks and benefits of testing vs not testing) were discussed along with recent recs/guidelines. -USPSTF grade A and B recommendations reviewed with patient; age-appropriate recommendations, preventive care, screening tests, etc discussed and encouraged; healthy living encouraged; see AVS for patient education given to patient -Discussed importance of 150 minutes of physical activity weekly, eat two servings of fish weekly, eat one serving of tree nuts ( cashews, pistachios, pecans, almonds.SABRA) every other day, eat 6 servings of fruit/vegetables daily and drink plenty of water and avoid sweet beverages.  -Reviewed Health Maintenance: yes

## 2024-01-20 ENCOUNTER — Ambulatory Visit: Payer: Self-pay | Admitting: Family Medicine

## 2024-02-08 ENCOUNTER — Telehealth: Payer: Self-pay

## 2024-02-08 NOTE — Progress Notes (Signed)
 Pharmacy Quality Measure Review  This patient is appearing on a report for being at risk of failing the  Kidney Health Evaluation for Patients with Diabetes measure this calendar year.   Last documented A1c on 08/30/23  Last documented UACR on 02/26/23  Appointment with PCP scheduled on 02/29/24. Will update appointment notes to obtain UACR at follow up.  Moo Gravley E. Marsh, PharmD, CPP Clinical Pharmacist [facility] [phone]

## 2024-02-09 DIAGNOSIS — G4733 Obstructive sleep apnea (adult) (pediatric): Secondary | ICD-10-CM | POA: Diagnosis not present

## 2024-02-14 DIAGNOSIS — I739 Peripheral vascular disease, unspecified: Secondary | ICD-10-CM | POA: Diagnosis not present

## 2024-02-14 DIAGNOSIS — G4733 Obstructive sleep apnea (adult) (pediatric): Secondary | ICD-10-CM | POA: Diagnosis not present

## 2024-02-14 DIAGNOSIS — G253 Myoclonus: Secondary | ICD-10-CM | POA: Diagnosis not present

## 2024-02-14 DIAGNOSIS — R6889 Other general symptoms and signs: Secondary | ICD-10-CM | POA: Diagnosis not present

## 2024-02-29 ENCOUNTER — Ambulatory Visit: Admitting: Family Medicine

## 2024-03-02 ENCOUNTER — Other Ambulatory Visit (HOSPITAL_COMMUNITY): Payer: Self-pay

## 2024-03-15 ENCOUNTER — Encounter: Payer: Self-pay | Admitting: Medical

## 2024-03-15 ENCOUNTER — Ambulatory Visit (INDEPENDENT_AMBULATORY_CARE_PROVIDER_SITE_OTHER): Payer: Self-pay | Admitting: Medical

## 2024-03-15 ENCOUNTER — Other Ambulatory Visit: Payer: Self-pay

## 2024-03-15 VITALS — BP 120/80 | HR 84 | Temp 98.0°F | Ht 69.0 in | Wt 260.0 lb

## 2024-03-15 DIAGNOSIS — Z20828 Contact with and (suspected) exposure to other viral communicable diseases: Secondary | ICD-10-CM

## 2024-03-15 DIAGNOSIS — R519 Headache, unspecified: Secondary | ICD-10-CM

## 2024-03-15 DIAGNOSIS — J3489 Other specified disorders of nose and nasal sinuses: Secondary | ICD-10-CM

## 2024-03-15 LAB — POC SOFIA 2 FLU + SARS ANTIGEN FIA
Influenza A, POC: NEGATIVE
Influenza B, POC: NEGATIVE
SARS Coronavirus 2 Ag: NEGATIVE

## 2024-03-15 MED ORDER — OSELTAMIVIR PHOSPHATE 75 MG PO CAPS: 75.0000 mg | ORAL_CAPSULE | Freq: Two times a day (BID) | ORAL | 0 refills | Status: AC

## 2024-03-15 NOTE — Patient Instructions (Addendum)
-  Rest and stay well hydrated (by drinking water and other liquids).  -Continue Allegra  and Flonase . -Take over-the-counter medicines (i.e. Dayquil, Nyquil) to help relieve your symptoms. -For your cough, you may use use cough drops/throat lozenges and/or drink warm liquids (like tea with honey).  -Consider retesting yourself for flu with an at-home flu test in 24-48 hours.  -If you test positive for flu or if you develop new/worsening symptoms (i.e. fever, body aches, increased headache), consider filling the prescription for Tamiflu (Oseltamivir) to help shorten the duration of your symptoms and decrease their severity. Tamiflu should be taken with food, and it may cause nausea or vomiting. If you do experience nausea or vomiting with Tamiflu, you do not need to continue taking it. -Send MyChart message to provider, call or schedule return visit as needed for new/worsening symptoms (i.e. shortness of breath, vomiting) or if your symptoms do not improve as discussed with recommended treatment.

## 2024-03-15 NOTE — Progress Notes (Signed)
 [facility] [address] [place], [place] [address]   Office Visit Note  Patient Name: John York Date of Birth [id number]  Medical Record number [id number]  Date of Service: 03/15/2024  Chief Complaint  Patient presents with   Acute Visit    Patient c/o sneezing and runny nose x 2-3 days. He has not taken Allegra  in 2 days because he ran out. He just returned from [place] and states his grandson tested positive for the flu.     HPI Discussed the use of AI scribe software for clinical note transcription with the patient, who gave verbal consent to proceed.  History of Present Illness CHIDI SHIRER is a 63 year old male with diabetes who presents with upper respiratory symptoms after exposure to influenza A.  Upper respiratory symptoms - Onset 2 days ago after exposure to grandson diagnosed with influenza A 5 days ago in [place]  (patient was visiting last week, last contact with grandson 4 days ago) - Sneezing and rhinorrhea, with nasal discharge - Mild headache - Brief scratchy throat, now resolved; no sore throat - Minimal cough, with slightly discolored sputum last night and this morning  Constitutional symptoms - No fever, chills, sweats, or myalgia - No nausea, diarrhea, or changes in appetite - Normal energy levels - Continues regular exercise routine without significant fatigue  Vertigo - History of vertigo - Slightly increased vertigo over the last several days - Unsure if vertigo is related to current upper respiratory symptoms  Medication use and recent changes - Received seasonal influenza vaccination a couple of months ago - Ran out of Allegra  while in [place] ; resumed yesterday - Uses Flonase  regularly - No significant change in symptoms with resumption of Allegra    Current Medication:  Outpatient Encounter Medications as of 03/15/2024  Medication Sig   Blood Glucose Monitoring Suppl (CONTOUR NEXT ONE) KIT 1 each by Does not  apply route daily. Please check blood sugar once daily.  May check 1 additional time per day as needed.   cholecalciferol (VITAMIN D ) 1000 units tablet Take 1,000 Units daily by mouth.   fexofenadine  (ALLEGRA ) 180 MG tablet TAKE 1 TABLET BY MOUTH EVERY DAY   fluticasone  (FLONASE ) 50 MCG/ACT nasal spray Place 2 sprays into both nostrils daily.   glucose blood test strip Use as instructed   Melatonin 5 MG CHEW Chew by mouth.   metFORMIN  (GLUCOPHAGE ) 1000 MG tablet Take 1 tablet (1,000 mg total) by mouth daily with breakfast.   metoCLOPramide  (REGLAN ) 5 MG tablet TAKE 1 TABLET (5 MG TOTAL) BY MOUTH 2 (TWO) TIMES DAILY AS NEEDED FOR NAUSEA.   rosuvastatin  (CRESTOR ) 10 MG tablet Take 1 tablet (10 mg total) by mouth daily.   Semaglutide  (RYBELSUS ) 14 MG TABS Take 1 tablet (14 mg total) by mouth daily.   celecoxib  (CELEBREX ) 100 MG capsule Take 1 capsule (100 mg total) by mouth 2 (two) times daily. (Patient not taking: Reported on 03/15/2024)   traZODone (DESYREL) 50 MG tablet Take 50 mg by mouth at bedtime. (Patient not taking: Reported on 03/15/2024)   Ubrogepant  (UBRELVY ) 100 MG TABS Take 1 tablet (100 mg total) by mouth daily as needed. (Patient not taking: Reported on 03/15/2024)   No facility-administered encounter medications on file as of 03/15/2024.      Medical History: Past Medical History:  Diagnosis Date   Diabetes mellitus without complication (HCC)    Family history of malignant neoplasm of gastrointestinal tract    Personal history of colonic  polyps 2013   Screening for obesity    Sleep apnea    Special screening for malignant neoplasms, colon 2013     Vital Signs: BP 120/80   Pulse 84   Temp 98 F (36.7 C)   Ht 5' 9 (1.753 m)   Wt 260 lb (117.9 kg)   SpO2 98%   BMI 38.40 kg/m    Review of Systems  Constitutional:  Negative for appetite change, chills, fatigue and fever.  HENT:  Positive for congestion, rhinorrhea and sneezing. Negative for sore throat and  trouble swallowing.   Respiratory:  Positive for cough (mild). Negative for shortness of breath.   Gastrointestinal:  Negative for diarrhea, nausea and vomiting.  Musculoskeletal:  Negative for myalgias and neck pain.  Neurological:  Positive for headaches (mild).    Physical Exam Vitals reviewed.  Constitutional:      General: He is not in acute distress.    Appearance: He is not ill-appearing.  HENT:     Head: Normocephalic.     Right Ear: Ear canal and external ear normal.     Left Ear: Tympanic membrane, ear canal and external ear normal.     Ears:     Comments: Small nonobstructive cerumen to right EAC, limited view of right TM.    Nose: Mucosal edema (mild) and congestion (mild) present. No rhinorrhea.     Mouth/Throat:     Mouth: Mucous membranes are moist. No oral lesions.     Pharynx: No pharyngeal swelling or posterior oropharyngeal erythema.     Tonsils: No tonsillar exudate.  Cardiovascular:     Rate and Rhythm: Normal rate and regular rhythm.     Heart sounds: No murmur heard.    No friction rub. No gallop.  Pulmonary:     Effort: Pulmonary effort is normal.     Breath sounds: Normal breath sounds. No wheezing, rhonchi or rales.  Musculoskeletal:     Cervical back: Neck supple. No rigidity.  Lymphadenopathy:     Cervical: No cervical adenopathy.  Neurological:     Mental Status: He is alert.     Results for orders placed or performed in visit on 03/15/24 (from the past 24 hours)  POC SOFIA 2 FLU + SARS ANTIGEN FIA     Status: None   Collection Time: 03/15/24  8:51 AM  Result Value Ref Range   Influenza A, POC Negative Negative   Influenza B, POC Negative Negative   SARS Coronavirus 2 Ag Negative Negative     Assessment/Plan: 1. Rhinorrhea (Primary) 2. Acute nonintractable headache, unspecified headache type 3. Exposure to influenza  - POC SOFIA 2 FLU + SARS ANTIGEN FIA - oseltamivir (TAMIFLU) 75 MG capsule; Take 1 capsule (75 mg total) by mouth 2  (two) times daily for 5 days.  Dispense: 10 capsule; Refill: 0  Assessment & Plan Rhinorrhea, sneezing and mild headache; Recent influenza exposure. Symptoms could suggest early viral upper respiratory infection, possibly influenza, despite negative tests. Differential also includes allergic rhinitis. Discussed possible false negative flu antigen test. Discussed option for Tamiflu prescription to hold given upcoming holiday. Discussed possible adverse effects of Tamiflu and appropriate use.  - Continue usual Allegra  and Flonase . - Consider at home influenza testing if symptoms persist or worsen in next 24-48 hours. - Provided printed Tamiflu prescription for potential influenza treatment if symptoms worsen (i.e. fever, body aches, worsening headache) or tests positive for influenza at home. - Discussed taking precautions to prevent spread of infection, especially if  symptoms worsen or tests positive. - Advised symptomatic treatment and monitoring. Follow up as needed with questions or new/worsening/persistent symptoms.  Patient Instructions  -Rest and stay well hydrated (by drinking water and other liquids).  -Continue Allegra  and Flonase . -Take over-the-counter medicines (i.e. Dayquil, Nyquil) to help relieve your symptoms. -For your cough, you may use use cough drops/throat lozenges and/or drink warm liquids (like tea with honey).  -Consider retesting yourself for flu with an at-home flu test in 24-48 hours.  -If you test positive for flu or if you develop new/worsening symptoms (i.e. fever, body aches, increased headache), consider filling the prescription for Tamiflu (Oseltamivir) to help shorten the duration of your symptoms and decrease their severity. Tamiflu should be taken with food, and it may cause nausea or vomiting. If you do experience nausea or vomiting with Tamiflu, you do not need to continue taking it. -Send MyChart message to provider, call or schedule return visit as needed  for new/worsening symptoms (i.e. shortness of breath, vomiting) or if your symptoms do not improve as discussed with recommended treatment.    General Counseling: jemarion roycroft understanding of the findings of todays visit and plan of treatment. he has been encouraged to call the office with any questions or concerns that should arise related to todays visit.    Time spent:20 Minutes    Joen Arts PA-C Physician Assistant

## 2024-03-21 ENCOUNTER — Other Ambulatory Visit: Payer: Self-pay | Admitting: Family Medicine

## 2024-03-22 NOTE — Telephone Encounter (Signed)
 Requested medication (s) are due for refill today: Yes  Requested medication (s) are on the active medication list: Yes  Last refill:  08/30/23  Future visit scheduled: Yes  Notes to clinic:  Manual review.    Requested Prescriptions  Pending Prescriptions Disp Refills   RYBELSUS  14 MG TABS [Pharmacy Med Name: RYBELSUS  14 MG TABLET] 90 tablet 1    Sig: Take 1 tablet (14 mg total) by mouth daily.     Off-Protocol Failed - 03/22/2024 12:23 PM      Failed - Medication not assigned to a protocol, review manually.      Passed - Valid encounter within last 12 months    Recent Outpatient Visits           2 months ago Well adult exam   [facility] Mire, MD   6 months ago Dyslipidemia associated with type 2 diabetes mellitus [facility])   [facility] Sowles, Krichna, MD

## 2024-04-05 ENCOUNTER — Ambulatory Visit: Admitting: Family Medicine

## 2024-04-05 ENCOUNTER — Encounter: Payer: Self-pay | Admitting: Family Medicine

## 2024-04-05 ENCOUNTER — Other Ambulatory Visit: Payer: Self-pay

## 2024-04-05 VITALS — BP 128/80 | HR 68 | Resp 16 | Ht 69.0 in | Wt 261.2 lb

## 2024-04-05 DIAGNOSIS — E785 Hyperlipidemia, unspecified: Secondary | ICD-10-CM

## 2024-04-05 DIAGNOSIS — E66812 Obesity, class 2: Secondary | ICD-10-CM | POA: Diagnosis not present

## 2024-04-05 DIAGNOSIS — Z7984 Long term (current) use of oral hypoglycemic drugs: Secondary | ICD-10-CM

## 2024-04-05 DIAGNOSIS — E1169 Type 2 diabetes mellitus with other specified complication: Secondary | ICD-10-CM

## 2024-04-05 DIAGNOSIS — J302 Other seasonal allergic rhinitis: Secondary | ICD-10-CM

## 2024-04-05 DIAGNOSIS — I739 Peripheral vascular disease, unspecified: Secondary | ICD-10-CM | POA: Diagnosis not present

## 2024-04-05 DIAGNOSIS — Z79899 Other long term (current) drug therapy: Secondary | ICD-10-CM | POA: Diagnosis not present

## 2024-04-05 DIAGNOSIS — J3089 Other allergic rhinitis: Secondary | ICD-10-CM

## 2024-04-05 DIAGNOSIS — E559 Vitamin D deficiency, unspecified: Secondary | ICD-10-CM | POA: Diagnosis not present

## 2024-04-05 DIAGNOSIS — G4733 Obstructive sleep apnea (adult) (pediatric): Secondary | ICD-10-CM

## 2024-04-05 LAB — POCT GLYCOSYLATED HEMOGLOBIN (HGB A1C): Hemoglobin A1C: 5.5 % (ref 4.0–5.6)

## 2024-04-05 MED ORDER — RYBELSUS 14 MG PO TABS: 14.0000 mg | ORAL_TABLET | Freq: Every day | ORAL | 1 refills | Status: AC

## 2024-04-05 MED ORDER — FLUTICASONE PROPIONATE 50 MCG/ACT NA SUSP: 2.0000 | Freq: Every day | NASAL | 1 refills | Status: AC

## 2024-04-05 MED ORDER — FEXOFENADINE HCL 180 MG PO TABS: 180.0000 mg | ORAL_TABLET | Freq: Every day | ORAL | 1 refills | Status: AC

## 2024-04-05 MED ORDER — ROSUVASTATIN CALCIUM 10 MG PO TABS: 10.0000 mg | ORAL_TABLET | Freq: Every day | ORAL | 1 refills | Status: AC

## 2024-04-05 MED ORDER — METFORMIN HCL 1000 MG PO TABS: 1000.0000 mg | ORAL_TABLET | Freq: Every day | ORAL | 1 refills | Status: AC

## 2024-04-05 NOTE — Progress Notes (Signed)
 Name: John York   MRN: [id number]    DOB: [birth year]   Date:04/05/2024       Progress Note  Subjective  Chief Complaint  Chief Complaint  Patient presents with   Medical Management of Chronic Issues    Has not had eye exam in 2025   Discussed the use of AI scribe software for clinical note transcription with the patient, who gave verbal consent to proceed.  History of Present Illness John York is a 64 year old male with diabetes and obstructive sleep apnea who presents for a routine follow-up visit.  He recently experienced a head cold after returning from a trip to [place] , where he was exposed to his grandson who had influenza A. He tested negative for both influenza and COVID-19. He recalls having a similar head cold when he previously had COVID-19, but this time it was not the case.  He has diabetes and finds it challenging to maintain his diet during the holidays. He is currently taking metformin  1000 mg once daily and Rybelsus  14 mg, which he feels is effective in managing his condition. His weight is stable around 260 pounds, having lost about 9 pounds since December of the previous year. The patient reports not being hungry all the time and needing to urinate in the morning. He denies polyphagia, polydipsia or polyuria. A1C is 5.5 % today but denies hypoglycemic episodes   He has a history of toenail fungal infection, treated about four years ago with partial resolution. The infection is not bothersome and is primarily cosmetic.  He has post-concussion syndrome with vertigo, which he experienced again after returning from [place] . He associates this with a head cold and notes it felt similar to symptoms experienced two years ago after an accident. He also has a history of migraine headaches, which sometimes include vertigo, but he has not experienced migraines recently.  He has obstructive sleep apnea and uses a CPAP machine nightly, which he finds beneficial. He  experiences vivid dreams, which have decreased in frequency with the use of melatonin. He was prescribed trazodone but has not felt the need to use it.  He takes rosuvastatin  for cholesterol management and reports no side effects. For allergies, he uses fexofenadine  and Flonase , both over-the-counter, and notes that his symptoms have become more frequent over time, though they were previously seasonal.    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 Dellie   COLONOSCOPY W/ POLYPECTOMY  2008   7mm size sessile non bleeding polyp was found in the sigmoid colon   COLONOSCOPY WITH PROPOFOL  N/A 02/24/2017   Procedure: COLONOSCOPY WITH PROPOFOL ;  Surgeon: Dellie Louanne MATSU, MD;  Location: [facility] ENDOSCOPY;  Service: Endoscopy;  Laterality: N/A;   COLONOSCOPY WITH PROPOFOL  N/A 03/26/2022   Procedure: COLONOSCOPY WITH PROPOFOL ;  Surgeon: Unk Corinn Skiff, MD;  Location: [facility] ENDOSCOPY;  Service: Gastroenterology;  Laterality: N/A;    Family History  Problem Relation Age of Onset   Cancer Sister        breast   Colon cancer Brother    Diabetes Father    Diabetes Mother    Hypertension Mother     Social History   Tobacco Use   Smoking status: Never   Smokeless tobacco: Never  Substance Use Topics   Alcohol use: No     Alcohol/week: 0.0 standard drinks of alcohol    Current Medications[1]  Allergies[2]  I personally reviewed active problem list, medication list, allergies, family history with the patient/caregiver today.   ROS  Ten systems reviewed and is negative except as mentioned in HPI    Objective Physical Exam  CONSTITUTIONAL: Patient appears well-developed and well-nourished. No distress. HEENT: Head atraumatic, normocephalic, neck supple. CARDIOVASCULAR: Normal rate, regular rhythm and normal heart sounds. No murmur heard. No BLE edema. Feet examined, no abnormalities. PULMONARY: Effort normal and breath sounds normal. No respiratory distress. ABDOMINAL: There is no tenderness or distention. MUSCULOSKELETAL: Normal gait. Without gross motor or sensory deficit. PSYCHIATRIC: Patient has a normal mood and affect. Behavior is normal. Judgment and thought content normal.   Vitals:   04/05/24 0838  BP: 128/80  Pulse: 68  Resp: 16  SpO2: 99%  Weight: 261 lb 3.2 oz (118.5 kg)  Height: 5' 9 (1.753 m)    Body mass index is 38.57 kg/m.  Recent Results (from the past 2160 hours)  POC SOFIA 2 FLU + SARS ANTIGEN FIA     Status: None   Collection Time: 03/15/24  8:51 AM  Result Value Ref Range   Influenza A, POC Negative Negative   Influenza B, POC Negative Negative   SARS Coronavirus 2 Ag Negative Negative  POCT glycosylated hemoglobin (Hb A1C)     Status: None   Collection Time: 04/05/24  8:42 AM  Result Value Ref Range   Hemoglobin A1C 5.5 4.0 - 5.6 %   HbA1c POC (<> result, manual entry)     HbA1c, POC (prediabetic range)     HbA1c, POC (controlled diabetic range)      Diabetic Foot Exam:  Diabetic foot exam was performed with the following findings:   Normal sensation of 10g monofilament Intact posterior tibialis and dorsalis pedis pulses Thick and brittle toenails 1 st and 5 th of the right foot       PHQ2/9:    04/05/2024    8:32 AM 01/14/2024    2:44 PM  08/30/2023    3:01 PM 03/01/2023    2:34 PM 01/01/2023    8:03 AM  Depression screen PHQ 2/9  Decreased Interest 0 0 0 0 0  Down, Depressed, Hopeless 0 0 0 0 0  PHQ - 2 Score 0 0 0 0 0  Altered sleeping    0 1  Tired, decreased energy    0 1  Change in appetite    0 0  Feeling bad or failure about yourself     0 0  Trouble concentrating    0 1  Moving slowly or fidgety/restless    0 0  Suicidal thoughts    0 0  PHQ-9 Score    0  3   Difficult doing work/chores    Not difficult at all Not difficult at all     Data saved with a previous flowsheet row definition    phq 9 is negative  Fall Risk:  04/05/2024    8:32 AM 01/14/2024    2:44 PM 08/30/2023    3:01 PM 03/01/2023    2:33 PM 01/01/2023    8:03 AM  Fall Risk   Falls in the past year? 0 0 0 1 0  Number falls in past yr: 0 0 0 0   Injury with Fall? 0 0  0  0    Risk for fall due to : No Fall Risks No Fall Risks No Fall Risks Other (Comment) No Fall Risks  Risk for fall due to: Comment    while sleeping   Follow up Falls evaluation completed Falls prevention discussed Falls prevention discussed;Education provided;Falls evaluation completed Falls evaluation completed;Education provided;Falls prevention discussed Falls prevention discussed;Education provided;Falls evaluation completed     Data saved with a previous flowsheet row definition      Assessment & Plan Type 2 diabetes mellitus with dyslipidemia Diabetes well-controlled with current medications. Dyslipidemia managed effectively with rosuvastatin . - Continue metformin  1000 mg once daily. - Continue Rybelsus  14 mg daily. - Continue rosuvastatin  daily. - A1c is at goal  - Ordered comprehensive metabolic panel, CBC, lipid panel, vitamin D , B12, and folate levels.  Morbid obesity BMI over 35 with co-morbidities such as DM and dyslipidemia Weight stable with current diabetes management contributing to weight control. - Continue current diabetes management plan  as it aids in weight control.  Obstructive sleep apnea Managed with CPAP. Considering new machine due to age of current device. - Contact [employer] to inquire about in-home titration study for a new CPAP machine. - Ensure regular cleaning and maintenance of CPAP equipment.  Vitamin D  deficiency Monitoring necessary due to previous low levels and long-term medication use. - Ordered vitamin D  level as part of routine blood work.  Small vessel disease of the brain Maintaining diabetes control is crucial for management. - Continue current diabetes management plan to support brain health.  Perennial allergic rhinitis with seasonal variation Managed with fexofenadine  and Flonase . Symptoms persistent year-round, worsen seasonally. - Prescribed fexofenadine  and Flonase  for ongoing management. - Advised to purchase medications based on cost-effectiveness, either over-the-counter or with insurance coverage.        [1]  Current Outpatient Medications:    Blood Glucose Monitoring Suppl (CONTOUR NEXT ONE) KIT, 1 each by Does not apply route daily. Please check blood sugar once daily.  May check 1 additional time per day as needed., Disp: 1 kit, Rfl: 0   cholecalciferol (VITAMIN D ) 1000 units tablet, Take 1,000 Units daily by mouth., Disp: , Rfl:    glucose blood test strip, Use as instructed, Disp: 100 each, Rfl: 12   Melatonin 5 MG CHEW, Chew by mouth., Disp: , Rfl:    fexofenadine  (ALLEGRA ) 180 MG tablet, Take 1 tablet (180 mg total) by mouth daily., Disp: 90 tablet, Rfl: 1   fluticasone  (FLONASE ) 50 MCG/ACT nasal spray, Place 2 sprays into both nostrils daily., Disp: 48 g, Rfl: 1   metFORMIN  (GLUCOPHAGE ) 1000 MG tablet, Take 1 tablet (1,000 mg total) by mouth daily with breakfast., Disp: 90 tablet, Rfl: 1   rosuvastatin  (CRESTOR ) 10 MG tablet, Take 1 tablet (10 mg total) by mouth daily., Disp: 90 tablet, Rfl: 1   Semaglutide  (RYBELSUS ) 14 MG TABS, Take 1 tablet (14 mg total) by mouth  daily., Disp: 90 tablet, Rfl: 1   traZODone (DESYREL) 50 MG tablet, Take 50 mg by mouth at bedtime. (Patient not taking: Reported on 04/05/2024), Disp: , Rfl:  [2] No Known Allergies

## 2024-04-06 ENCOUNTER — Other Ambulatory Visit: Payer: Self-pay

## 2024-04-06 DIAGNOSIS — Z79899 Other long term (current) drug therapy: Secondary | ICD-10-CM

## 2024-04-06 DIAGNOSIS — E1169 Type 2 diabetes mellitus with other specified complication: Secondary | ICD-10-CM

## 2024-04-06 DIAGNOSIS — E559 Vitamin D deficiency, unspecified: Secondary | ICD-10-CM

## 2024-04-07 ENCOUNTER — Ambulatory Visit: Payer: Self-pay | Admitting: Family Medicine

## 2024-04-07 LAB — CBC WITH DIFFERENTIAL/PLATELET
Basophils Absolute: 0 x10E3/uL (ref 0.0–0.2)
Basos: 1 %
EOS (ABSOLUTE): 0.3 x10E3/uL (ref 0.0–0.4)
Eos: 6 %
Hematocrit: 48.8 % (ref 37.5–51.0)
Hemoglobin: 16.1 g/dL (ref 13.0–17.7)
Immature Grans (Abs): 0 x10E3/uL (ref 0.0–0.1)
Immature Granulocytes: 0 %
Lymphocytes Absolute: 1.8 x10E3/uL (ref 0.7–3.1)
Lymphs: 37 %
MCH: 30.8 pg (ref 26.6–33.0)
MCHC: 33 g/dL (ref 31.5–35.7)
MCV: 93 fL (ref 79–97)
Monocytes Absolute: 0.5 x10E3/uL (ref 0.1–0.9)
Monocytes: 10 %
Neutrophils Absolute: 2.2 x10E3/uL (ref 1.4–7.0)
Neutrophils: 46 %
Platelets: 318 x10E3/uL (ref 150–450)
RBC: 5.23 x10E6/uL (ref 4.14–5.80)
RDW: 12.7 % (ref 11.6–15.4)
WBC: 4.8 x10E3/uL (ref 3.4–10.8)

## 2024-04-07 LAB — LIPID PANEL
Chol/HDL Ratio: 2 ratio (ref 0.0–5.0)
Cholesterol, Total: 103 mg/dL (ref 100–199)
HDL: 52 mg/dL
LDL Chol Calc (NIH): 40 mg/dL (ref 0–99)
Triglycerides: 40 mg/dL (ref 0–149)
VLDL Cholesterol Cal: 11 mg/dL (ref 5–40)

## 2024-04-07 LAB — COMPREHENSIVE METABOLIC PANEL WITH GFR
ALT: 28 IU/L (ref 0–44)
AST: 31 IU/L (ref 0–40)
Albumin: 4.5 g/dL (ref 3.9–4.9)
Alkaline Phosphatase: 133 IU/L — ABNORMAL HIGH (ref 47–123)
BUN/Creatinine Ratio: 12 (ref 10–24)
BUN: 12 mg/dL (ref 8–27)
Bilirubin Total: 0.5 mg/dL (ref 0.0–1.2)
CO2: 24 mmol/L (ref 20–29)
Calcium: 9.3 mg/dL (ref 8.6–10.2)
Chloride: 103 mmol/L (ref 96–106)
Creatinine, Ser: 1 mg/dL (ref 0.76–1.27)
Globulin, Total: 2.8 g/dL (ref 1.5–4.5)
Glucose: 92 mg/dL (ref 70–99)
Potassium: 4.1 mmol/L (ref 3.5–5.2)
Sodium: 142 mmol/L (ref 134–144)
Total Protein: 7.3 g/dL (ref 6.0–8.5)
eGFR: 84 mL/min/1.73

## 2024-04-07 LAB — VITAMIN D 25 HYDROXY (VIT D DEFICIENCY, FRACTURES): Vit D, 25-Hydroxy: 41.6 ng/mL (ref 30.0–100.0)

## 2024-04-07 LAB — B12 AND FOLATE PANEL
Folate: 17.6 ng/mL
Vitamin B-12: 557 pg/mL (ref 232–1245)

## 2024-04-07 LAB — MICROALBUMIN / CREATININE URINE RATIO
Creatinine, Urine: 25.7 mg/dL
Microalb/Creat Ratio: 37 mg/g{creat} — ABNORMAL HIGH (ref 0–29)
Microalbumin, Urine: 9.6 ug/mL

## 2024-04-17 LAB — OPHTHALMOLOGY REPORT-SCANNED

## 2024-04-24 ENCOUNTER — Ambulatory Visit: Payer: Self-pay | Admitting: Medical

## 2024-10-20 ENCOUNTER — Ambulatory Visit: Admitting: Family Medicine

## 2025-01-12 ENCOUNTER — Ambulatory Visit: Admitting: Family Medicine
